# Patient Record
Sex: Female | Born: 1945 | Race: White | Hispanic: No | Smoking: Former smoker
Health system: Southern US, Community
[De-identification: ages and names within clinical notes are randomized; demographics above are authoritative.]

## PROBLEM LIST (undated history)

## (undated) HISTORY — PX: MOUTH SURGERY: SHX715

---

## 2014-12-03 DIAGNOSIS — E785 Hyperlipidemia, unspecified: Secondary | ICD-10-CM | POA: Insufficient documentation

## 2014-12-03 DIAGNOSIS — I10 Essential (primary) hypertension: Secondary | ICD-10-CM | POA: Insufficient documentation

## 2014-12-03 DIAGNOSIS — I6990 Unspecified sequelae of unspecified cerebrovascular disease: Secondary | ICD-10-CM

## 2014-12-03 DIAGNOSIS — I480 Paroxysmal atrial fibrillation: Secondary | ICD-10-CM | POA: Insufficient documentation

## 2014-12-03 DIAGNOSIS — I4891 Unspecified atrial fibrillation: Secondary | ICD-10-CM | POA: Insufficient documentation

## 2014-12-03 HISTORY — DX: Essential (primary) hypertension: I10

## 2014-12-03 HISTORY — DX: Unspecified sequelae of unspecified cerebrovascular disease: I69.90

## 2014-12-03 HISTORY — DX: Unspecified atrial fibrillation: I48.91

## 2014-12-03 HISTORY — DX: Paroxysmal atrial fibrillation: I48.0

## 2014-12-03 HISTORY — DX: Hyperlipidemia, unspecified: E78.5

## 2015-03-30 DIAGNOSIS — I5032 Chronic diastolic (congestive) heart failure: Secondary | ICD-10-CM | POA: Insufficient documentation

## 2015-03-30 DIAGNOSIS — I5022 Chronic systolic (congestive) heart failure: Secondary | ICD-10-CM

## 2015-03-30 HISTORY — DX: Chronic systolic (congestive) heart failure: I50.22

## 2015-03-30 HISTORY — DX: Chronic diastolic (congestive) heart failure: I50.32

## 2016-08-25 DIAGNOSIS — K922 Gastrointestinal hemorrhage, unspecified: Secondary | ICD-10-CM | POA: Diagnosis not present

## 2016-08-25 DIAGNOSIS — E0590 Thyrotoxicosis, unspecified without thyrotoxic crisis or storm: Secondary | ICD-10-CM

## 2016-08-25 DIAGNOSIS — I509 Heart failure, unspecified: Secondary | ICD-10-CM | POA: Diagnosis not present

## 2016-08-25 DIAGNOSIS — E876 Hypokalemia: Secondary | ICD-10-CM

## 2016-08-25 DIAGNOSIS — I4891 Unspecified atrial fibrillation: Secondary | ICD-10-CM

## 2016-08-25 DIAGNOSIS — D649 Anemia, unspecified: Secondary | ICD-10-CM | POA: Diagnosis not present

## 2016-08-25 DIAGNOSIS — I10 Essential (primary) hypertension: Secondary | ICD-10-CM | POA: Diagnosis not present

## 2016-08-26 DIAGNOSIS — I10 Essential (primary) hypertension: Secondary | ICD-10-CM | POA: Diagnosis not present

## 2016-08-26 DIAGNOSIS — E0590 Thyrotoxicosis, unspecified without thyrotoxic crisis or storm: Secondary | ICD-10-CM | POA: Diagnosis not present

## 2016-08-26 DIAGNOSIS — I509 Heart failure, unspecified: Secondary | ICD-10-CM | POA: Diagnosis not present

## 2016-08-26 DIAGNOSIS — D649 Anemia, unspecified: Secondary | ICD-10-CM | POA: Diagnosis not present

## 2016-08-26 DIAGNOSIS — K922 Gastrointestinal hemorrhage, unspecified: Secondary | ICD-10-CM | POA: Diagnosis not present

## 2016-08-26 DIAGNOSIS — I4891 Unspecified atrial fibrillation: Secondary | ICD-10-CM | POA: Diagnosis not present

## 2016-08-26 DIAGNOSIS — E876 Hypokalemia: Secondary | ICD-10-CM | POA: Diagnosis not present

## 2016-08-27 DIAGNOSIS — E876 Hypokalemia: Secondary | ICD-10-CM | POA: Diagnosis not present

## 2016-08-27 DIAGNOSIS — I4891 Unspecified atrial fibrillation: Secondary | ICD-10-CM | POA: Diagnosis not present

## 2016-08-27 DIAGNOSIS — E0590 Thyrotoxicosis, unspecified without thyrotoxic crisis or storm: Secondary | ICD-10-CM | POA: Diagnosis not present

## 2016-08-27 DIAGNOSIS — K922 Gastrointestinal hemorrhage, unspecified: Secondary | ICD-10-CM | POA: Diagnosis not present

## 2016-08-27 DIAGNOSIS — I509 Heart failure, unspecified: Secondary | ICD-10-CM | POA: Diagnosis not present

## 2016-08-27 DIAGNOSIS — D649 Anemia, unspecified: Secondary | ICD-10-CM | POA: Diagnosis not present

## 2016-08-27 DIAGNOSIS — I10 Essential (primary) hypertension: Secondary | ICD-10-CM | POA: Diagnosis not present

## 2016-08-28 DIAGNOSIS — E876 Hypokalemia: Secondary | ICD-10-CM | POA: Diagnosis not present

## 2016-08-28 DIAGNOSIS — I10 Essential (primary) hypertension: Secondary | ICD-10-CM | POA: Diagnosis not present

## 2016-08-28 DIAGNOSIS — E0590 Thyrotoxicosis, unspecified without thyrotoxic crisis or storm: Secondary | ICD-10-CM | POA: Diagnosis not present

## 2016-08-28 DIAGNOSIS — D649 Anemia, unspecified: Secondary | ICD-10-CM | POA: Diagnosis not present

## 2016-08-28 DIAGNOSIS — K922 Gastrointestinal hemorrhage, unspecified: Secondary | ICD-10-CM | POA: Diagnosis not present

## 2016-08-28 DIAGNOSIS — I4891 Unspecified atrial fibrillation: Secondary | ICD-10-CM | POA: Diagnosis not present

## 2016-08-28 DIAGNOSIS — I509 Heart failure, unspecified: Secondary | ICD-10-CM | POA: Diagnosis not present

## 2016-08-29 DIAGNOSIS — I509 Heart failure, unspecified: Secondary | ICD-10-CM | POA: Diagnosis not present

## 2016-08-29 DIAGNOSIS — I10 Essential (primary) hypertension: Secondary | ICD-10-CM | POA: Diagnosis not present

## 2016-08-29 DIAGNOSIS — I4891 Unspecified atrial fibrillation: Secondary | ICD-10-CM | POA: Diagnosis not present

## 2016-08-29 DIAGNOSIS — K922 Gastrointestinal hemorrhage, unspecified: Secondary | ICD-10-CM | POA: Diagnosis not present

## 2016-08-29 DIAGNOSIS — E0590 Thyrotoxicosis, unspecified without thyrotoxic crisis or storm: Secondary | ICD-10-CM | POA: Diagnosis not present

## 2016-08-29 DIAGNOSIS — E876 Hypokalemia: Secondary | ICD-10-CM | POA: Diagnosis not present

## 2016-08-29 DIAGNOSIS — D649 Anemia, unspecified: Secondary | ICD-10-CM | POA: Diagnosis not present

## 2016-08-30 DIAGNOSIS — I10 Essential (primary) hypertension: Secondary | ICD-10-CM | POA: Diagnosis not present

## 2016-08-30 DIAGNOSIS — K922 Gastrointestinal hemorrhage, unspecified: Secondary | ICD-10-CM | POA: Diagnosis not present

## 2016-08-30 DIAGNOSIS — I509 Heart failure, unspecified: Secondary | ICD-10-CM | POA: Diagnosis not present

## 2016-08-30 DIAGNOSIS — D649 Anemia, unspecified: Secondary | ICD-10-CM | POA: Diagnosis not present

## 2016-08-30 DIAGNOSIS — E876 Hypokalemia: Secondary | ICD-10-CM | POA: Diagnosis not present

## 2016-10-11 DIAGNOSIS — K922 Gastrointestinal hemorrhage, unspecified: Secondary | ICD-10-CM

## 2016-10-11 HISTORY — DX: Gastrointestinal hemorrhage, unspecified: K92.2

## 2017-12-05 ENCOUNTER — Encounter: Payer: Self-pay | Admitting: Cardiology

## 2017-12-05 ENCOUNTER — Ambulatory Visit (INDEPENDENT_AMBULATORY_CARE_PROVIDER_SITE_OTHER): Payer: Medicare Other | Admitting: Cardiology

## 2017-12-05 VITALS — BP 132/60 | HR 60 | Resp 14 | Ht 64.0 in | Wt 168.6 lb

## 2017-12-05 DIAGNOSIS — I6990 Unspecified sequelae of unspecified cerebrovascular disease: Secondary | ICD-10-CM | POA: Diagnosis not present

## 2017-12-05 DIAGNOSIS — R0989 Other specified symptoms and signs involving the circulatory and respiratory systems: Secondary | ICD-10-CM

## 2017-12-05 DIAGNOSIS — I10 Essential (primary) hypertension: Secondary | ICD-10-CM

## 2017-12-05 DIAGNOSIS — I480 Paroxysmal atrial fibrillation: Secondary | ICD-10-CM | POA: Diagnosis not present

## 2017-12-05 DIAGNOSIS — I350 Nonrheumatic aortic (valve) stenosis: Secondary | ICD-10-CM

## 2017-12-05 DIAGNOSIS — I5032 Chronic diastolic (congestive) heart failure: Secondary | ICD-10-CM | POA: Diagnosis not present

## 2017-12-05 DIAGNOSIS — E785 Hyperlipidemia, unspecified: Secondary | ICD-10-CM

## 2017-12-05 HISTORY — DX: Nonrheumatic aortic (valve) stenosis: I35.0

## 2017-12-05 NOTE — Patient Instructions (Addendum)
Medication Instructions:  Your physician recommends that you continue on your current medications as directed. Please refer to the Current Medication list given to you today.   Labwork: Your physician recommends that you return for lab work today: BMP, LFT, and lipids   Testing/Procedures: Your physician has requested that you have an echocardiogram. Echocardiography is a painless test that uses sound waves to create images of your heart. It provides your doctor with information about the size and shape of your heart and how well your heart's chambers and valves are working. This procedure takes approximately one hour. There are no restrictions for this procedure.  Your physician has requested that you have a carotid duplex. This test is an ultrasound of the carotid arteries in your neck. It looks at blood flow through these arteries that supply the brain with blood. Allow one hour for this exam. There are no restrictions or special instructions.     Follow-Up: Your physician recommends that you schedule a follow-up appointment in: 3 months.   Any Other Special Instructions Will Be Listed Below (If Applicable).     If you need a refill on your cardiac medications before your next appointment, please call your pharmacy.  Echocardiogram An echocardiogram, or echocardiography, uses sound waves (ultrasound) to produce an image of your heart. The echocardiogram is simple, painless, obtained within a short period of time, and offers valuable information to your health care provider. The images from an echocardiogram can provide information such as:  Evidence of coronary artery disease (CAD).  Heart size.  Heart muscle function.  Heart valve function.  Aneurysm detection.  Evidence of a past heart attack.  Fluid buildup around the heart.  Heart muscle thickening.  Assess heart valve function.  Tell a health care provider about:  Any allergies you have.  All medicines you  are taking, including vitamins, herbs, eye drops, creams, and over-the-counter medicines.  Any problems you or family members have had with anesthetic medicines.  Any blood disorders you have.  Any surgeries you have had.  Any medical conditions you have.  Whether you are pregnant or may be pregnant. What happens before the procedure? No special preparation is needed. Eat and drink normally. What happens during the procedure?  In order to produce an image of your heart, gel will be applied to your chest and a wand-like tool (transducer) will be moved over your chest. The gel will help transmit the sound waves from the transducer. The sound waves will harmlessly bounce off your heart to allow the heart images to be captured in real-time motion. These images will then be recorded.  You may need an IV to receive a medicine that improves the quality of the pictures. What happens after the procedure? You may return to your normal schedule including diet, activities, and medicines, unless your health care provider tells you otherwise. This information is not intended to replace advice given to you by your health care provider. Make sure you discuss any questions you have with your health care provider. Document Released: 03/10/2000 Document Revised: 10/30/2015 Document Reviewed: 11/18/2012 Elsevier Interactive Patient Education  2017 [employer].

## 2017-12-05 NOTE — Progress Notes (Signed)
Cardiology Office Note:    Date:  12/05/2017   ID:  Kathryn Moody, DOB 1946/01/10, MRN [id number]  PCP:  Hortencia Conradi, NP  Cardiologist:  Gypsy Balsam, MD    Referring MD: No ref. provider found   Chief Complaint  Patient presents with  . Follow-up  Doing well  History of Present Illness:    Kathryn Moody is a 72 y.o. female with paroxysmal atrial fibrillation history of CVA, also aortic stenosis has not been seen by me for year.  Seems to be doing fine apparently last summer she had some GI bleed her Eliquis has been discontinued for a while and then she was restarted on a quite interesting dose that she takes 5 mg daily denies having the bleeding anymore.  The pathology of peptic ulcer disease and that was taking care of.  She was also taken off aspirin. Denies having a tightness squeezing pressure burning chest.  There is no shortness of breath there is no passing out  No past medical history on file.    Current Medications: Current Meds  Medication Sig  . apixaban (ELIQUIS) 5 MG TABS tablet Take 1 tablet by mouth daily.   Marland Kitchen atorvastatin (LIPITOR) 10 MG tablet Take 1 tablet by mouth daily.  . Calcium Carb-Ergocalciferol 500-200 MG-UNIT TABS Take 1 tablet by mouth daily.  . carvedilol (COREG) 25 MG tablet Take 1 tablet by mouth 2 (two) times daily.  Marland Kitchen diltiazem (DILT-XR) 240 MG 24 hr capsule Take 2 tablets by mouth daily.   . fluticasone (FLONASE) 50 MCG/ACT nasal spray Place 1 spray into both nostrils daily.  . furosemide (LASIX) 40 MG tablet Take 120 mg by mouth daily.  . furosemide (LASIX) 80 MG tablet Take 1 tablet by mouth daily.  . magnesium oxide (MAG-OX) 400 MG tablet Take 1 tablet by mouth daily.  Marland Kitchen omeprazole (PRILOSEC) 40 MG capsule Take 1 capsule by mouth daily.  . pantoprazole (PROTONIX) 40 MG tablet Take 1 tablet by mouth daily.  . Potassium Chloride ER 20 MEQ TBCR Take 6 tablets by mouth daily.      Allergies:   Prednisone; Digoxin;  and Methimazole   Social History   Socioeconomic History  . Marital status: Married    Spouse name: Not on file  . Number of children: Not on file  . Years of education: Not on file  . Highest education level: Not on file  Occupational History  . Not on file  Social Needs  . Financial resource strain: Not on file  . Food insecurity:    Worry: Not on file    Inability: Not on file  . Transportation needs:    Medical: Not on file    Non-medical: Not on file  Tobacco Use  . Smoking status: Former Games developer  . Smokeless tobacco: Never Used  Substance and Sexual Activity  . Alcohol use: Yes  . Drug use: Never  . Sexual activity: Not on file  Lifestyle  . Physical activity:    Days per week: Not on file    Minutes per session: Not on file  . Stress: Not on file  Relationships  . Social connections:    Talks on phone: Not on file    Gets together: Not on file    Attends religious service: Not on file    Active member of club or organization: Not on file    Attends meetings of clubs or organizations: Not on file    Relationship status: Not  on file  Other Topics Concern  . Not on file  Social History Narrative  . Not on file     Family History: The patient's family history includes Charcot-Marie-Tooth disease in her brother, daughter, and mother; Heart disease in her father; Thyroid disease in her mother. ROS:   Please see the history of present illness.    All 14 point review of systems negative except as described per history of present illness  EKGs/Labs/Other Studies Reviewed:      Recent Labs: No results found for requested labs within last 8760 hours.  Recent Lipid Panel No results found for: CHOL, TRIG, HDL, CHOLHDL, VLDL, LDLCALC, LDLDIRECT  Physical Exam:    VS:  BP 132/60   Pulse 60   Resp 14   Ht 5\' 4"  (1.626 m)   Wt 168 lb 9.6 oz (76.5 kg)   BMI 28.94 kg/m     Wt Readings from Last 3 Encounters:  12/05/17 168 lb 9.6 oz (76.5 kg)     GEN:   Well nourished, well developed in no acute distress HEENT: Normal NECK: No JVD; bilateral carotid bruit LYMPHATICS: No lymphadenopathy CARDIAC: RRR, systolic ejection murmur grade 3/6 best heard at right upper portion of the sternum with radiation towards the neck., no rubs, no gallops RESPIRATORY:  Clear to auscultation without rales, wheezing or rhonchi  ABDOMEN: Soft, non-tender, non-distended MUSCULOSKELETAL:  No edema; No deformity  SKIN: Warm and dry LOWER EXTREMITIES: no swelling NEUROLOGIC:  Alert and oriented x 3 PSYCHIATRIC:  Normal affect   ASSESSMENT:    1. Paroxysmal atrial fibrillation (HCC)   2. Essential hypertension   3. Chronic diastolic congestive heart failure (HCC)   4. Late effects of CVA (cerebrovascular accident)   5. Dyslipidemia   6. Nonrheumatic aortic valve stenosis    PLAN:    In order of problems listed above:  1. Paroxysmal atrial fibrillation seems to be maintaining sinus rhythm.  Anticoagulated with Eliquis however at very strange dosing 5 mg daily.I will ask her to have EKG today to confirm the rhythm. 2. Essential hypertension blood pressure well controlled continue present management. 3. Chronic diastolic congestive heart failure we will ask her to have echocardiogram will be done to recheck left ventricular ejection fraction as well as assess degree of aortic stenosis. 4. Dyslipidemia we will check a fasting lipid profile today she only had breakfast and this is already 3:00 PM 5. Aortic stenosis we will repeat her echocardiogram to recheck on that.  We will schedule her to have bilateral carotid ultrasounds.  It is because of bilateral bruit which is most likely murmur conducted from aortic stenosis however I will make sure she does not have any significant cardiac arterial disease.   Medication Adjustments/Labs and Tests Ordered: Current medicines are reviewed at length with the patient today.  Concerns regarding medicines are outlined above.   No orders of the defined types were placed in this encounter.  Medication changes: No orders of the defined types were placed in this encounter.   Signed, Georgeanna Lea, MD, [facility] 12/05/2017 3:07 PM    [facility]

## 2017-12-06 LAB — HEPATIC FUNCTION PANEL
ALT: 13 IU/L (ref 0–32)
AST: 17 IU/L (ref 0–40)
Albumin: 4.9 g/dL — ABNORMAL HIGH (ref 3.5–4.8)
Alkaline Phosphatase: 110 IU/L (ref 39–117)
Bilirubin Total: 0.3 mg/dL (ref 0.0–1.2)
Bilirubin, Direct: 0.09 mg/dL (ref 0.00–0.40)
Total Protein: 7.6 g/dL (ref 6.0–8.5)

## 2017-12-06 LAB — LIPID PANEL
Chol/HDL Ratio: 2.5 ratio (ref 0.0–4.4)
Cholesterol, Total: 224 mg/dL — ABNORMAL HIGH (ref 100–199)
HDL: 91 mg/dL (ref >39)
LDL Calculated: 115 mg/dL — ABNORMAL HIGH (ref 0–99)
Triglycerides: 90 mg/dL (ref 0–149)
VLDL Cholesterol Cal: 18 mg/dL (ref 5–40)

## 2017-12-06 LAB — BASIC METABOLIC PANEL
BUN/Creatinine Ratio: 20 (ref 12–28)
BUN: 18 mg/dL (ref 8–27)
CO2: 27 mmol/L (ref 20–29)
Calcium: 10.3 mg/dL (ref 8.7–10.3)
Chloride: 99 mmol/L (ref 96–106)
Creatinine, Ser: 0.88 mg/dL (ref 0.57–1.00)
GFR calc Af Amer: 77 mL/min/{1.73_m2} (ref >59)
GFR calc non Af Amer: 67 mL/min/{1.73_m2} (ref >59)
Glucose: 95 mg/dL (ref 65–99)
Potassium: 3.9 mmol/L (ref 3.5–5.2)
Sodium: 142 mmol/L (ref 134–144)

## 2017-12-07 ENCOUNTER — Telehealth: Payer: Self-pay | Admitting: Emergency Medicine

## 2017-12-07 ENCOUNTER — Telehealth: Payer: Self-pay | Admitting: Cardiology

## 2017-12-07 DIAGNOSIS — E785 Hyperlipidemia, unspecified: Secondary | ICD-10-CM

## 2017-12-07 MED ORDER — ATORVASTATIN CALCIUM 20 MG PO TABS: 20.0000 mg | ORAL_TABLET | Freq: Every day | ORAL | 3 refills | Status: DC

## 2017-12-07 MED ORDER — APIXABAN 5 MG PO TABS: 5.0000 mg | ORAL_TABLET | Freq: Two times a day (BID) | ORAL | 3 refills | Status: DC

## 2017-12-07 MED ORDER — APIXABAN 5 MG PO TABS: 5.0000 mg | ORAL_TABLET | Freq: Every day | ORAL | 3 refills | Status: DC

## 2017-12-07 NOTE — Addendum Note (Signed)
Addended by: Lita MainsBOWMAN, Kairie Vangieson R on: 12/07/2017 04:29 PM   Modules accepted: Orders

## 2017-12-07 NOTE — Telephone Encounter (Signed)
Patient asking for clarification on eliquis dosing. Patient wants to know if Dr. Bing MatterKrasowski would like her to take eliquis twice daily rather than once. I will verify with him.

## 2017-12-07 NOTE — Telephone Encounter (Signed)
Informed patient that cholesterol was elevated and to increase lipitor to 20 mg daily per Dr. Bing MatterKrasowski. Patient also in formed to return in 6 weeks for labs. Patient verbally understands.

## 2017-12-07 NOTE — Telephone Encounter (Signed)
Per Dr. Bing MatterKrasowski patient is to take eliquis 5mg  twice daily. Patient informed.

## 2017-12-07 NOTE — Telephone Encounter (Signed)
Patient has questions about the amount of eloquis she is taking

## 2017-12-17 ENCOUNTER — Other Ambulatory Visit: Payer: Self-pay

## 2017-12-17 MED ORDER — APIXABAN 5 MG PO TABS: 5.0000 mg | ORAL_TABLET | Freq: Two times a day (BID) | ORAL | 3 refills | Status: DC

## 2018-01-17 LAB — LIPID PANEL
Chol/HDL Ratio: 2.2 ratio (ref 0.0–4.4)
Cholesterol, Total: 185 mg/dL (ref 100–199)
HDL: 83 mg/dL (ref >39)
LDL Calculated: 87 mg/dL (ref 0–99)
Triglycerides: 74 mg/dL (ref 0–149)
VLDL Cholesterol Cal: 15 mg/dL (ref 5–40)

## 2018-01-17 LAB — ALT: ALT: 14 IU/L (ref 0–32)

## 2018-01-17 LAB — AST: AST: 16 IU/L (ref 0–40)

## 2018-01-22 ENCOUNTER — Ambulatory Visit (INDEPENDENT_AMBULATORY_CARE_PROVIDER_SITE_OTHER): Payer: Medicare Other

## 2018-01-22 ENCOUNTER — Other Ambulatory Visit: Payer: Self-pay

## 2018-01-22 DIAGNOSIS — R0989 Other specified symptoms and signs involving the circulatory and respiratory systems: Secondary | ICD-10-CM | POA: Diagnosis not present

## 2018-01-22 DIAGNOSIS — I5032 Chronic diastolic (congestive) heart failure: Secondary | ICD-10-CM

## 2018-01-22 NOTE — Progress Notes (Signed)
Echocardiogram 2D Echocardiogram has been performed.  Excell Seltzer, [place]

## 2018-01-31 ENCOUNTER — Other Ambulatory Visit: Payer: Self-pay | Admitting: Unknown Physician Specialty

## 2018-01-31 DIAGNOSIS — R195 Other fecal abnormalities: Secondary | ICD-10-CM

## 2018-02-15 ENCOUNTER — Ambulatory Visit
Admission: RE | Admit: 2018-02-15 | Discharge: 2018-02-15 | Disposition: A | Discharge status: Home / Self Care | Payer: Medicare Other | Source: Ambulatory Visit | Attending: Unknown Physician Specialty | Admitting: Unknown Physician Specialty

## 2018-02-15 DIAGNOSIS — R195 Other fecal abnormalities: Secondary | ICD-10-CM

## 2018-03-07 ENCOUNTER — Encounter: Payer: Self-pay | Admitting: Cardiology

## 2018-03-07 ENCOUNTER — Ambulatory Visit (INDEPENDENT_AMBULATORY_CARE_PROVIDER_SITE_OTHER): Payer: Medicare Other | Admitting: Cardiology

## 2018-03-07 VITALS — BP 128/62 | HR 81 | Wt 170.4 lb

## 2018-03-07 DIAGNOSIS — I5032 Chronic diastolic (congestive) heart failure: Secondary | ICD-10-CM

## 2018-03-07 DIAGNOSIS — I10 Essential (primary) hypertension: Secondary | ICD-10-CM | POA: Diagnosis not present

## 2018-03-07 DIAGNOSIS — I480 Paroxysmal atrial fibrillation: Secondary | ICD-10-CM

## 2018-03-07 DIAGNOSIS — I350 Nonrheumatic aortic (valve) stenosis: Secondary | ICD-10-CM

## 2018-03-07 DIAGNOSIS — I6930 Unspecified sequelae of cerebral infarction: Secondary | ICD-10-CM

## 2018-03-07 DIAGNOSIS — I6990 Unspecified sequelae of unspecified cerebrovascular disease: Secondary | ICD-10-CM

## 2018-03-07 DIAGNOSIS — E785 Hyperlipidemia, unspecified: Secondary | ICD-10-CM

## 2018-03-07 DIAGNOSIS — R0989 Other specified symptoms and signs involving the circulatory and respiratory systems: Secondary | ICD-10-CM

## 2018-03-07 HISTORY — DX: Other specified symptoms and signs involving the circulatory and respiratory systems: R09.89

## 2018-03-07 NOTE — Progress Notes (Signed)
Cardiology Office Note:    Date:  03/07/2018   ID:  Kathryn Moody, DOB 08/27/1945, MRN [id number]  PCP:  Hortencia ConradiGeorge, Anita Mcadory E, NP  Cardiologist:  Gypsy Balsamobert Yuvin Bussiere, MD    Referring MD: Hortencia ConradiGeorge, Mashayla Lavin E, NP   Chief Complaint  Patient presents with  . Follow up on testing  Doing well  History of Present Illness:    Kathryn Moody is a 72 y.o. female with paroxysmal atrial fibrillation, anticoagulated with Eliquis 5 mg twice daily, hypertension, history of CVA, aortic stenosis which appears to be mild based on last echocardiogram.  She does have loud left carotid bruit apparently her primary care physician does check her carotid bruit in his office will try to contact the office and get copy of the report if nothing was done within the next last 6 months then we will need to do the test  No past medical history on file.    Current Medications: Current Meds  Medication Sig  . apixaban (ELIQUIS) 5 MG TABS tablet Take 1 tablet (5 mg total) by mouth 2 (two) times daily.  Marland Kitchen. atorvastatin (LIPITOR) 20 MG tablet Take 1 tablet (20 mg total) by mouth daily.  . Calcium Carb-Ergocalciferol 500-200 MG-UNIT TABS Take 1 tablet by mouth daily.  . carvedilol (COREG) 25 MG tablet Take 1 tablet by mouth 2 (two) times daily.  Marland Kitchen. diltiazem (DILT-XR) 240 MG 24 hr capsule Take 2 tablets by mouth daily.   . fluticasone (FLONASE) 50 MCG/ACT nasal spray Place 1 spray into both nostrils daily.  . furosemide (LASIX) 40 MG tablet Take 120 mg by mouth daily.  . furosemide (LASIX) 80 MG tablet Take 1 tablet by mouth daily.  . magnesium oxide (MAG-OX) 400 MG tablet Take 1 tablet by mouth daily.  . methimazole (TAPAZOLE) 10 MG tablet Take 1 tablet by mouth daily.  . pantoprazole (PROTONIX) 40 MG tablet Take 1 tablet by mouth daily.  . Potassium Chloride ER 20 MEQ TBCR Take 6 tablets by mouth daily.      Allergies:   Prednisone and Digoxin   Social History   Socioeconomic History  . Marital  status: Married    Spouse name: Not on file  . Number of children: Not on file  . Years of education: Not on file  . Highest education level: Not on file  Occupational History  . Not on file  Social Needs  . Financial resource strain: Not on file  . Food insecurity:    Worry: Not on file    Inability: Not on file  . Transportation needs:    Medical: Not on file    Non-medical: Not on file  Tobacco Use  . Smoking status: Former Games developermoker  . Smokeless tobacco: Never Used  Substance and Sexual Activity  . Alcohol use: Yes  . Drug use: Never  . Sexual activity: Not on file  Lifestyle  . Physical activity:    Days per week: Not on file    Minutes per session: Not on file  . Stress: Not on file  Relationships  . Social connections:    Talks on phone: Not on file    Gets together: Not on file    Attends religious service: Not on file    Active member of club or organization: Not on file    Attends meetings of clubs or organizations: Not on file    Relationship status: Not on file  Other Topics Concern  . Not on file  Social History  Narrative  . Not on file     Family History: The patient's family history includes Charcot-Marie-Tooth disease in her brother, daughter, and mother; Heart disease in her father; Thyroid disease in her mother. ROS:   Please see the history of present illness.    All 14 point review of systems negative except as described per history of present illness  EKGs/Labs/Other Studies Reviewed:      Recent Labs: 12/05/2017: BUN 18; Creatinine, Ser 0.88; Potassium 3.9; Sodium 142 01/17/2018: ALT 14  Recent Lipid Panel    Component Value Date/Time   CHOL 185 01/17/2018 0923   TRIG 74 01/17/2018 0923   HDL 83 01/17/2018 0923   CHOLHDL 2.2 01/17/2018 0923   LDLCALC 87 01/17/2018 0923    Physical Exam:    VS:  BP 128/62   Pulse 81   Wt 170 lb 6.4 oz (77.3 kg)   SpO2 98%   BMI 29.25 kg/m     Wt Readings from Last 3 Encounters:  03/07/18 170  lb 6.4 oz (77.3 kg)  12/05/17 168 lb 9.6 oz (76.5 kg)     GEN:  Well nourished, well developed in no acute distress HEENT: Normal NECK: No JVD; loud left carotid bruit LYMPHATICS: No lymphadenopathy CARDIAC: RRR, systolic ejection murmur grade 2/6 to 3/6 best heard at the right upper portion of the sternum, no rubs, no gallops RESPIRATORY:  Clear to auscultation without rales, wheezing or rhonchi  ABDOMEN: Soft, non-tender, non-distended MUSCULOSKELETAL:  No edema; No deformity  SKIN: Warm and dry LOWER EXTREMITIES: no swelling NEUROLOGIC:  Alert and oriented x 3 PSYCHIATRIC:  Normal affect   ASSESSMENT:    1. Nonrheumatic aortic valve stenosis   2. Chronic diastolic congestive heart failure (HCC)   3. Essential hypertension   4. Paroxysmal atrial fibrillation (HCC)   5. Late effects of CVA (cerebrovascular accident)   6. Dyslipidemia   7. Left carotid bruit    PLAN:    In order of problems listed above:  1. Aortic stenosis only mild based on last echocardiogram will continue monitoring. 2. Chronic diastolic congestive heart failure appears to be compensated. 3. Essential hypertension blood pressure well controlled continue present management. 4. Paroxysmal atrial fibrillation maintaining sinus rhythm on Eliquis which I will continue. 5. Dyslipidemia continue with Lipitor 20. 6. Left carotid bruit will contact primary care physician to get copy of her last cardiac ultrasound.   Medication Adjustments/Labs and Tests Ordered: Current medicines are reviewed at length with the patient today.  Concerns regarding medicines are outlined above.  No orders of the defined types were placed in this encounter.  Medication changes: No orders of the defined types were placed in this encounter.   Signed, Georgeanna Lea, MD, [facility] 03/07/2018 3:03 PM    [facility]

## 2018-03-07 NOTE — Patient Instructions (Signed)
Medication Instructions:  Your physician recommends that you continue on your current medications as directed. Please refer to the Current Medication list given to you today.  If you need a refill on your cardiac medications before your next appointment, please call your pharmacy.   Lab work: None.  If you have labs (blood work) drawn today and your tests are completely normal, you will receive your results only by: . MyChart Message (if you have MyChart) OR . A paper copy in the mail If you have any lab test that is abnormal or we need to change your treatment, we will call you to review the results.  Testing/Procedures: None.   Follow-Up: At [facility], you and your health needs are our priority.  As part of our continuing mission to provide you with exceptional heart care, we have created designated Provider Care Teams.  These Care Teams include your primary Cardiologist (physician) and Advanced Practice Providers (APPs -  Physician Assistants and Nurse Practitioners) who all work together to provide you with the care you need, when you need it. You will need a follow up appointment in 5 months.  Please call our office 2 months in advance to schedule this appointment.  You may see No primary care provider on file. or another member of our [facility] Provider Team in [place]: Brian Munley, MD . Rajan Revankar, MD  Any Other Special Instructions Will Be Listed Below (If Applicable).    

## 2018-07-16 ENCOUNTER — Telehealth: Payer: Self-pay | Admitting: Cardiology

## 2018-07-16 NOTE — Telephone Encounter (Signed)
Virtual Visit Pre-Appointment Phone Call  "(Name), I am calling you today to discuss your upcoming appointment. We are currently trying to limit exposure to the virus that causes COVID-19 by seeing patients at home rather than in the office."  1. "What is the BEST phone number to call the day of the visit?" - include this in appointment notes  2. Do you have or have access to (through a family member/friend) a smartphone with video capability that we can use for your visit?" a. If yes - list this number in appt notes as cell (if different from BEST phone #) and list the appointment type as a VIDEO visit in appointment notes b. If no - list the appointment type as a PHONE visit in appointment notes  3. Confirm consent - "In the setting of the current Covid19 crisis, you are scheduled for a (phone or video) visit with your provider on (date) at (time).  Just as we do with many in-office visits, in order for you to participate in this visit, we must obtain consent.  If you'd like, I can send this to your mychart (if signed up) or email for you to review.  Otherwise, I can obtain your verbal consent now.  All virtual visits are billed to your insurance company just like a normal visit would be.  By agreeing to a virtual visit, we'd like you to understand that the technology does not allow for your provider to perform an examination, and thus may limit your provider's ability to fully assess your condition. If your provider identifies any concerns that need to be evaluated in person, we will make arrangements to do so.  Finally, though the technology is pretty good, we cannot assure that it will always work on either your or our end, and in the setting of a video visit, we may have to convert it to a phone-only visit.  In either situation, we cannot ensure that we have a secure connection.  Are you willing to proceed?" STAFF: Did the patient verbally acknowledge consent to telehealth visit? Document  YES/NO here: YES  4. Advise patient to be prepared - "Two hours prior to your appointment, go ahead and check your blood pressure, pulse, oxygen saturation, and your weight (if you have the equipment to check those) and write them all down. When your visit starts, your provider will ask you for this information. If you have an Apple Watch or Kardia device, please plan to have heart rate information ready on the day of your appointment. Please have a pen and paper handy nearby the day of the visit as well."  5. Give patient instructions for MyChart download to smartphone OR Doximity/Doxy.me as below if video visit (depending on what platform provider is using)  6. Inform patient they will receive a phone call 15 minutes prior to their appointment time (may be from unknown caller ID) so they should be prepared to answer    TELEPHONE CALL NOTE  Kathryn Moody has been deemed a candidate for a follow-up tele-health visit to limit community exposure during the Covid-19 pandemic. I spoke with the patient via phone to ensure availability of phone/video source, confirm preferred email & phone number, and discuss instructions and expectations.  I reminded Kathryn Moody to be prepared with any vital sign and/or heart rhythm information that could potentially be obtained via home monitoring, at the time of her visit. I reminded Kathryn Moody to expect a phone call prior to  her visit.  Kathryn Moody 07/16/2018 2:38 PM   INSTRUCTIONS FOR DOWNLOADING THE MYCHART APP TO SMARTPHONE  - The patient must first make sure to have activated MyChart and know their login information - If Apple, go to [employer] and type in MyChart in the search bar and download the app. If Android, ask patient to go to [employer] and type in [place] in the search bar and download the app. The app is free but as with any other app downloads, their phone may require them to verify saved payment  information or Apple/Android password.  - The patient will need to then log into the app with their MyChart username and password, and select [place] as their healthcare provider to link the account. When it is time for your visit, go to the MyChart app, find appointments, and click Begin Video Visit. Be sure to Select Allow for your device to access the Microphone and Camera for your visit. You will then be connected, and your provider will be with you shortly.  **If they have any issues connecting, or need assistance please contact MyChart service desk [phone])**  **If using a computer, in order to ensure the best quality for their visit they will need to use either of the following Internet Browsers: [employer] or Google Chrome**  IF USING DOXIMITY or DOXY.ME - The patient will receive a link just prior to their visit by text.     FULL LENGTH CONSENT FOR TELE-HEALTH VISIT   I hereby voluntarily request, consent and authorize [facility] and its employed or contracted physicians, physician assistants, nurse practitioners or other licensed health care professionals (the Practitioner), to provide me with telemedicine health care services (the Services") as deemed necessary by the treating Practitioner. I acknowledge and consent to receive the Services by the Practitioner via telemedicine. I understand that the telemedicine visit will involve communicating with the Practitioner through live audiovisual communication technology and the disclosure of certain medical information by electronic transmission. I acknowledge that I have been given the opportunity to request an in-person assessment or other available alternative prior to the telemedicine visit and am voluntarily participating in the telemedicine visit.  I understand that I have the right to withhold or withdraw my consent to the use of telemedicine in the course of my care at any time, without affecting my right  to future care or treatment, and that the Practitioner or I may terminate the telemedicine visit at any time. I understand that I have the right to inspect all information obtained and/or recorded in the course of the telemedicine visit and may receive copies of available information for a reasonable fee.  I understand that some of the potential risks of receiving the Services via telemedicine include:   Delay or interruption in medical evaluation due to technological equipment failure or disruption;  Information transmitted may not be sufficient (e.g. poor resolution of images) to allow for appropriate medical decision making by the Practitioner; and/or   In rare instances, security protocols could fail, causing a breach of personal health information.  Furthermore, I acknowledge that it is my responsibility to provide information about my medical history, conditions and care that is complete and accurate to the best of my ability. I acknowledge that Practitioner's advice, recommendations, and/or decision may be based on factors not within their control, such as incomplete or inaccurate data provided by me or distortions of diagnostic images or specimens that may result from electronic transmissions. I  understand that the practice of medicine is not an exact science and that Practitioner makes no warranties or guarantees regarding treatment outcomes. I acknowledge that I will receive a copy of this consent concurrently upon execution via email to the email address I last provided but may also request a printed copy by calling the office of [place].    I understand that my insurance will be billed for this visit.   I have read or had this consent read to me.  I understand the contents of this consent, which adequately explains the benefits and risks of the Services being provided via telemedicine.   I have been provided ample opportunity to ask questions regarding this consent and the Services  and have had my questions answered to my satisfaction.  I give my informed consent for the services to be provided through the use of telemedicine in my medical care  By participating in this telemedicine visit I agree to the above.

## 2018-07-26 DIAGNOSIS — G600 Hereditary motor and sensory neuropathy: Secondary | ICD-10-CM

## 2018-07-26 HISTORY — DX: Hereditary motor and sensory neuropathy: G60.0

## 2018-07-30 ENCOUNTER — Telehealth (INDEPENDENT_AMBULATORY_CARE_PROVIDER_SITE_OTHER): Payer: Medicare Other | Admitting: Cardiology

## 2018-07-30 ENCOUNTER — Encounter: Payer: Self-pay | Admitting: Cardiology

## 2018-07-30 ENCOUNTER — Other Ambulatory Visit: Payer: Self-pay

## 2018-07-30 VITALS — BP 105/56 | HR 60 | Wt 168.0 lb

## 2018-07-30 DIAGNOSIS — I5032 Chronic diastolic (congestive) heart failure: Secondary | ICD-10-CM | POA: Diagnosis not present

## 2018-07-30 DIAGNOSIS — I350 Nonrheumatic aortic (valve) stenosis: Secondary | ICD-10-CM

## 2018-07-30 DIAGNOSIS — R002 Palpitations: Secondary | ICD-10-CM

## 2018-07-30 DIAGNOSIS — I480 Paroxysmal atrial fibrillation: Secondary | ICD-10-CM

## 2018-07-30 DIAGNOSIS — I6990 Unspecified sequelae of unspecified cerebrovascular disease: Secondary | ICD-10-CM

## 2018-07-30 DIAGNOSIS — I10 Essential (primary) hypertension: Secondary | ICD-10-CM

## 2018-07-30 NOTE — Progress Notes (Signed)
Virtual Visit via Video Note   This visit type was conducted due to national recommendations for restrictions regarding the COVID-19 Pandemic (e.g. social distancing) in an effort to limit this patient's exposure and mitigate transmission in our community.  Due to her co-morbid illnesses, this patient is at least at moderate risk for complications without adequate follow up.  This format is felt to be most appropriate for this patient at this time.  All issues noted in this document were discussed and addressed.  A limited physical exam was performed with this format.  Please refer to the patient's chart for her consent to telehealth for [facility].  Evaluation Performed:  Follow-up visit  This visit type was conducted due to national recommendations for restrictions regarding the COVID-19 Pandemic (e.g. social distancing).  This format is felt to be most appropriate for this patient at this time.  All issues noted in this document were discussed and addressed.  No physical exam was performed (except for noted visual exam findings with Video Visits).  Please refer to the patient's chart (MyChart message for video visits and phone note for telephone visits) for the patient's consent to telehealth for [facility].  Date:  07/30/2018  ID: [name], DOB 1946/02/16, MRN [id number]   Patient Location: [address] [place] [address]   Provider location:   [facility]  PCP:  Hortencia Conradi, NP  Cardiologist:  Gypsy Balsam, MD     Chief Complaint: I have some palpitations  History of Present Illness:    Kathryn Moody is a 73 y.o. female  who presents via audio/video conferencing for a telehealth visit today.  With paroxysmal atrial fibrillation, anticoagulated, also remote history of CVA, mild aortic stenosis, essential hypertension, diastolic congestive heart failure.  We do have a video visit today to talk about her problems she described  rare episode of palpitations she said she feels exactly similar like she felt when she was 43st time recognized to have atrial fibrillation.  She feels her heart speeding up going towards her back.  There is no shortness of breath no chest pain associated with this sensation overall she seems to be doing well from that point review.   The patient does not have symptoms concerning for COVID-19 infection (fever, chills, cough, or new SHORTNESS OF BREATH).    Prior CV studies:   The following studies were reviewed today:  Echocardiogram done in the fall of last year showed  - Normal LVEF.   Mild AS.   Trace AR, TR MR.     No past medical history on file.     Current Meds  Medication Sig  . apixaban (ELIQUIS) 5 MG TABS tablet Take 1 tablet (5 mg total) by mouth 2 (two) times daily.  Marland Kitchen atorvastatin (LIPITOR) 20 MG tablet Take 1 tablet (20 mg total) by mouth daily.  . Calcium Carb-Ergocalciferol 500-200 MG-UNIT TABS Take 1 tablet by mouth daily.  . carvedilol (COREG) 25 MG tablet Take 1 tablet by mouth 2 (two) times daily.  Marland Kitchen diltiazem (DILT-XR) 240 MG 24 hr capsule Take 2 tablets by mouth daily.   . fluticasone (FLONASE) 50 MCG/ACT nasal spray Place 1 spray into both nostrils daily.  . furosemide (LASIX) 40 MG tablet Take 120 mg by mouth daily.  . furosemide (LASIX) 80 MG tablet Take 1 tablet by mouth daily.  . magnesium oxide (MAG-OX) 400 MG tablet Take 1 tablet by mouth daily.  . methimazole (TAPAZOLE)  10 MG tablet Take 1 tablet by mouth daily.  . pantoprazole (PROTONIX) 40 MG tablet Take 1 tablet by mouth daily.  . Potassium Chloride ER 20 MEQ TBCR Take 6 tablets by mouth daily.       Family History: The patient's family history includes Charcot-Marie-Tooth disease in her brother, daughter, and mother; Heart disease in her father; Thyroid disease in her mother.   ROS:   Please see the history of present illness.     All other systems reviewed and are negative.    Labs/Other Tests and Data Reviewed:     Recent Labs: 12/05/2017: BUN 18; Creatinine, Ser 0.88; Potassium 3.9; Sodium 142 01/17/2018: ALT 14  Recent Lipid Panel    Component Value Date/Time   CHOL 185 01/17/2018 0923   TRIG 74 01/17/2018 0923   HDL 83 01/17/2018 0923   CHOLHDL 2.2 01/17/2018 0923   LDLCALC 87 01/17/2018 0923      Exam:    Vital Signs:  BP (!) 105/56   Pulse 60   Wt 168 lb (76.2 kg)   BMI 28.84 kg/m     Wt Readings from Last 3 Encounters:  07/30/18 168 lb (76.2 kg)  03/07/18 170 lb 6.4 oz (77.3 kg)  12/05/17 168 lb 9.6 oz (76.5 kg)     Well nourished, well developed in no acute distress. Alert awake oriented x3 not in distress actually quite happy to be able to talk to me over the video link.  Diagnosis for this visit:   1. Chronic diastolic congestive heart failure (HCC)   2. Essential hypertension   3. Paroxysmal atrial fibrillation (HCC)   4. Nonrheumatic aortic valve stenosis   5. Late effects of CVA (cerebrovascular accident)      ASSESSMENT & PLAN:    1.  Chronic diastolic congestive heart failure compensated with quite significant dose of diuretic denies having any significant swelling of lower extremities but shortness of breath is still there no changes.  We will not change any of her medications. 2.  Essential hypertension blood pressure well controlled we will continue present management. 3.  Paroxysmal atrial fibrillation described to have some palpitation will send event recorder for her for 1 month to see if she get any significant recurrences of atrial fibrillation.  So far she is only on AV blocking agent. 4.  Aortic stenosis which is only mild based on last echocardiogram. 5.  Late effect CVA.  No new problems.  COVID-19 Education: The signs and symptoms of COVID-19 were discussed with the patient and how to seek care for testing (follow up with PCP or arrange E-visit).  The importance of social distancing was discussed today.   Patient Risk:   After full review of this patients clinical status, I feel that they are at least moderate risk at this time.  Time:   Today, I have spent 19 minutes with the patient with telehealth technology discussing pt health issues.  I spent 5 minutes reviewing her chart before the visit.  Visit was finished at 1:14 PM.    Medication Adjustments/Labs and Tests Ordered: Current medicines are reviewed at length with the patient today.  Concerns regarding medicines are outlined above.  No orders of the defined types were placed in this encounter.  Medication changes: No orders of the defined types were placed in this encounter.    Disposition: Follow-up in 4 months.  0 patch for 1 week.  Signed, Georgeanna Leaobert J. Kemper Hochman, MD, [facility] 07/30/2018 1:14 PM    [facility]

## 2018-07-30 NOTE — Patient Instructions (Signed)
Medication Instructions:  Your physician recommends that you continue on your current medications as directed. Please refer to the Current Medication list given to you today.  If you need a refill on your cardiac medications before your next appointment, please call your pharmacy.   Lab work: None ordered If you have labs (blood work) drawn today and your tests are completely normal, you will receive your results only by: Marland Kitchen MyChart Message (if you have MyChart) OR . A paper copy in the mail If you have any lab test that is abnormal or we need to change your treatment, we will call you to review the results.  Testing/Procedures: Your physician has recommended that you wear a holter monitor. Holter monitors are medical devices that record the heart's electrical activity. Doctors most often use these monitors to diagnose arrhythmias. Arrhythmias are problems with the speed or rhythm of the heartbeat. The monitor is a small, portable device. You can wear one while you do your normal daily activities. This is usually used to diagnose what is causing palpitations/syncope (passing out). You will wear this monitor for 1 week.  Follow-Up: At [facility], you and your health needs are our priority.  As part of our continuing mission to provide you with exceptional heart care, we have created designated Provider Care Teams.  These Care Teams include your primary Cardiologist (physician) and Advanced Practice Providers (APPs -  Physician Assistants and Nurse Practitioners) who all work together to provide you with the care you need, when you need it. You will need a follow up appointment in 4 months. You may see Gypsy Balsam or another member of our [employer] Provider Team in [place]: Norman Herrlich, MD . Belva Crome, MD

## 2018-08-05 ENCOUNTER — Other Ambulatory Visit: Payer: Medicare Other

## 2018-09-10 ENCOUNTER — Telehealth: Payer: Self-pay | Admitting: *Deleted

## 2018-09-10 NOTE — Telephone Encounter (Signed)
Informed patient of monitor results and no changes in management.

## 2018-09-10 NOTE — Telephone Encounter (Signed)
Pt has questions about results for heart monitor. Please call.

## 2018-10-01 ENCOUNTER — Telehealth: Payer: Self-pay | Admitting: Cardiology

## 2018-10-01 NOTE — Telephone Encounter (Signed)
Returned patient call. Not wanting to know about lasix. States Dr. Melina Copa wants to do a colonoscopy and wanted clearance. Informed her Dr. Melina Copa needs to call/fax request with what he wants to hold. Gave her the fax number and she is going to contact Dr. Melina Copa to send request.

## 2018-10-01 NOTE — Telephone Encounter (Signed)
Has questions about her Lasix dosage

## 2018-12-11 ENCOUNTER — Telehealth (INDEPENDENT_AMBULATORY_CARE_PROVIDER_SITE_OTHER): Payer: Medicare Other | Admitting: Cardiology

## 2018-12-11 ENCOUNTER — Encounter: Payer: Self-pay | Admitting: Cardiology

## 2018-12-11 ENCOUNTER — Other Ambulatory Visit: Payer: Self-pay

## 2018-12-11 VITALS — Wt 166.0 lb

## 2018-12-11 DIAGNOSIS — I6990 Unspecified sequelae of unspecified cerebrovascular disease: Secondary | ICD-10-CM

## 2018-12-11 DIAGNOSIS — I5032 Chronic diastolic (congestive) heart failure: Secondary | ICD-10-CM

## 2018-12-11 DIAGNOSIS — I10 Essential (primary) hypertension: Secondary | ICD-10-CM

## 2018-12-11 DIAGNOSIS — I480 Paroxysmal atrial fibrillation: Secondary | ICD-10-CM

## 2018-12-11 DIAGNOSIS — I350 Nonrheumatic aortic (valve) stenosis: Secondary | ICD-10-CM

## 2018-12-11 NOTE — Progress Notes (Signed)
Virtual Visit via Video Note   This visit type was conducted due to national recommendations for restrictions regarding the COVID-19 Pandemic (e.g. social distancing) in an effort to limit this patient's exposure and mitigate transmission in our community.  Due to her co-morbid illnesses, this patient is at least at moderate risk for complications without adequate follow up.  This format is felt to be most appropriate for this patient at this time.  All issues noted in this document were discussed and addressed.  A limited physical exam was performed with this format.  Please refer to the patient's chart for her consent to telehealth for [facility].  Evaluation Performed:  Follow-up visit  This visit type was conducted due to national recommendations for restrictions regarding the COVID-19 Pandemic (e.g. social distancing).  This format is felt to be most appropriate for this patient at this time.  All issues noted in this document were discussed and addressed.  No physical exam was performed (except for noted visual exam findings with Video Visits).  Please refer to the patient's chart (MyChart message for video visits and phone note for telephone visits) for the patient's consent to telehealth for [facility].  Date:  12/11/2018  ID: [name], DOB 01-19-1946, MRN [id number]   Patient Location: [address] [place] [address]   Provider location:   [place] [facility]  PCP:  Zoila Shutter, NP  Cardiologist:  Jenne Campus, MD     Chief Complaint: Doing well cardiac wise  History of Present Illness:    Kathryn Moody is a 73 y.o. female  who presents via audio/video conferencing for a telehealth visit today.  Past medical history significant for CVA, paroxysmal atrial fibrillation, anticoagulated, essential hypertension, mild aortic stenosis, diastolic congestive heart failure.  She does have a tele-visit with me today in the matter-of-fact  today she is getting ready to have her colonoscopy after my visit she will go to Dr. Mallie Mussel office and she will have colonoscopy done.  Denies have any cardiac complaints described to have some weakness in her legs but admits she does not do much and she blame lack of activity on it and I think she is right denies have any chest pain tightness squeezing pressure burning chest no swelling of lower extremities no palpitations.   The patient does not have symptoms concerning for COVID-19 infection (fever, chills, cough, or new SHORTNESS OF BREATH).    Prior CV studies:   The following studies were reviewed today:       No past medical history on file.     Current Meds  Medication Sig  . apixaban (ELIQUIS) 5 MG TABS tablet Take 1 tablet (5 mg total) by mouth 2 (two) times daily.  Marland Kitchen atorvastatin (LIPITOR) 20 MG tablet Take 1 tablet (20 mg total) by mouth daily.  . Calcium Carb-Ergocalciferol 500-200 MG-UNIT TABS Take 1 tablet by mouth daily.  . carvedilol (COREG) 25 MG tablet Take 1 tablet by mouth 2 (two) times daily.  Marland Kitchen diltiazem (DILT-XR) 240 MG 24 hr capsule Take 2 tablets by mouth daily.   . fluticasone (FLONASE) 50 MCG/ACT nasal spray Place 1 spray into both nostrils daily.  . furosemide (LASIX) 40 MG tablet Take 120 mg by mouth daily.  . furosemide (LASIX) 80 MG tablet Take 1 tablet by mouth daily.  . magnesium oxide (MAG-OX) 400 MG tablet Take 1 tablet by mouth daily.  . methimazole (TAPAZOLE) 10 MG tablet Take 1 tablet by  mouth daily.  . pantoprazole (PROTONIX) 40 MG tablet Take 1 tablet by mouth daily.  . Potassium Chloride ER 20 MEQ TBCR Take 6 tablets by mouth daily.       Family History: The patient's family history includes Charcot-Marie-Tooth disease in her brother, daughter, and mother; Heart disease in her father; Thyroid disease in her mother.   ROS:   Please see the history of present illness.     All other systems reviewed and are negative.   Labs/Other  Tests and Data Reviewed:     Recent Labs: 01/17/2018: ALT 14  Recent Lipid Panel    Component Value Date/Time   CHOL 185 01/17/2018 0923   TRIG 74 01/17/2018 0923   HDL 83 01/17/2018 0923   CHOLHDL 2.2 01/17/2018 0923   LDLCALC 87 01/17/2018 0923      Exam:    Vital Signs:  Wt 166 lb (75.3 kg)   BMI 28.49 kg/m     Wt Readings from Last 3 Encounters:  12/11/18 166 lb (75.3 kg)  07/30/18 168 lb (76.2 kg)  03/07/18 170 lb 6.4 oz (77.3 kg)     Well nourished, well developed in no acute distress. Alert awake and x3 talking to me from her home.  [facility] she is not in distress  Diagnosis for this visit:   1. Chronic diastolic congestive heart failure (HCC)   2. Essential hypertension   3. Paroxysmal atrial fibrillation (HCC)   4. Nonrheumatic aortic valve stenosis   5. Late effects of CVA (cerebrovascular accident)      ASSESSMENT & PLAN:    1.  Chronic diastolic congestive heart failure stable on appropriate medications which I will continue 2.  Essential hypertension blood pressure well controlled continue present management. 3.  Paroxysmal atrial fibrillation anticoagulated continue denies having any palpitations her monitor did not show any evidence of atrial fibrillation 4.  Nonrheumatic aortic valve stenosis only mild we will continue present management she will require another echocardiogram next month 5.  Late effects of CVA doing well from that point review  COVID-19 Education: The signs and symptoms of COVID-19 were discussed with the patient and how to seek care for testing (follow up with PCP or arrange E-visit).  The importance of social distancing was discussed today.  Patient Risk:   After full review of this patients clinical status, I feel that they are at least moderate risk at this time.  Time:   Today, I have spent 15 minutes with the patient with telehealth technology discussing pt health issues.  I spent 5  minutes reviewing her chart before the visit.  Visit was finished at 11:33 AM.    Medication Adjustments/Labs and Tests Ordered: Current medicines are reviewed at length with the patient today.  Concerns regarding medicines are outlined above.  No orders of the defined types were placed in this encounter.  Medication changes: No orders of the defined types were placed in this encounter.    Disposition: Follow-up 4 months  Signed, Georgeanna Leaobert J. Abdimalik Mayorquin, MD, [facility]ACC 12/11/2018 11:31 AM    [facility]

## 2018-12-11 NOTE — Patient Instructions (Addendum)
Medication Instructions:  Your physician recommends that you continue on your current medications as directed. Please refer to the Current Medication list given to you today.  If you need a refill on your cardiac medications before your next appointment, please call your pharmacy.   Lab work: None.  If you have labs (blood work) drawn today and your tests are completely normal, you will receive your results only by: . MyChart Message (if you have MyChart) OR . A paper copy in the mail If you have any lab test that is abnormal or we need to change your treatment, we will call you to review the results.  Testing/Procedures: None.    Follow-Up: At [facility], you and your health needs are our priority.  As part of our continuing mission to provide you with exceptional heart care, we have created designated Provider Care Teams.  These Care Teams include your primary Cardiologist (physician) and Advanced Practice Providers (APPs -  Physician Assistants and Nurse Practitioners) who all work together to provide you with the care you need, when you need it. You will need a follow up appointment in 4 months.  Please call our office 2 months in advance to schedule this appointment.  You may see No primary care provider on file. or another member of our [facility] Provider Team in [place]: Brian Munley, MD . Rajan Revankar, MD  Any Other Special Instructions Will Be Listed Below (If Applicable).     

## 2018-12-31 ENCOUNTER — Other Ambulatory Visit: Payer: Self-pay | Admitting: Cardiology

## 2019-04-16 ENCOUNTER — Ambulatory Visit (INDEPENDENT_AMBULATORY_CARE_PROVIDER_SITE_OTHER): Payer: Medicare Other | Admitting: Cardiology

## 2019-04-16 ENCOUNTER — Other Ambulatory Visit: Payer: Self-pay

## 2019-04-16 ENCOUNTER — Encounter: Payer: Self-pay | Admitting: Cardiology

## 2019-04-16 VITALS — BP 134/74 | HR 55 | Ht 64.0 in | Wt 173.0 lb

## 2019-04-16 DIAGNOSIS — I5032 Chronic diastolic (congestive) heart failure: Secondary | ICD-10-CM

## 2019-04-16 DIAGNOSIS — I6990 Unspecified sequelae of unspecified cerebrovascular disease: Secondary | ICD-10-CM

## 2019-04-16 DIAGNOSIS — I480 Paroxysmal atrial fibrillation: Secondary | ICD-10-CM | POA: Diagnosis not present

## 2019-04-16 DIAGNOSIS — I350 Nonrheumatic aortic (valve) stenosis: Secondary | ICD-10-CM | POA: Diagnosis not present

## 2019-04-16 DIAGNOSIS — I10 Essential (primary) hypertension: Secondary | ICD-10-CM | POA: Diagnosis not present

## 2019-04-16 NOTE — Patient Instructions (Signed)
Medication Instructions:  Your physician recommends that you continue on your current medications as directed. Please refer to the Current Medication list given to you today.  *If you need a refill on your cardiac medications before your next appointment, please call your pharmacy*  Lab Work: None.  If you have labs (blood work) drawn today and your tests are completely normal, you will receive your results only by: . MyChart Message (if you have MyChart) OR . A paper copy in the mail If you have any lab test that is abnormal or we need to change your treatment, we will call you to review the results.  Testing/Procedures: Your physician has requested that you have an echocardiogram. Echocardiography is a painless test that uses sound waves to create images of your heart. It provides your doctor with information about the size and shape of your heart and how well your heart's chambers and valves are working. This procedure takes approximately one hour. There are no restrictions for this procedure.    Follow-Up: At [facility], you and your health needs are our priority.  As part of our continuing mission to provide you with exceptional heart care, we have created designated Provider Care Teams.  These Care Teams include your primary Cardiologist (physician) and Advanced Practice Providers (APPs -  Physician Assistants and Nurse Practitioners) who all work together to provide you with the care you need, when you need it.  Your next appointment:   6 month(s)  The format for your next appointment:   In Person  Provider:   Robert Krasowski, MD  Other Instructions   Echocardiogram An echocardiogram is a procedure that uses painless sound waves (ultrasound) to produce an image of the heart. Images from an echocardiogram can provide important information about:  Signs of coronary artery disease (CAD).  Aneurysm detection. An aneurysm is a weak or damaged part of an artery wall that  bulges out from the normal force of blood pumping through the body.  Heart size and shape. Changes in the size or shape of the heart can be associated with certain conditions, including heart failure, aneurysm, and CAD.  Heart muscle function.  Heart valve function.  Signs of a past heart attack.  Fluid buildup around the heart.  Thickening of the heart muscle.  A tumor or infectious growth around the heart valves. Tell a health care provider about:  Any allergies you have.  All medicines you are taking, including vitamins, herbs, eye drops, creams, and over-the-counter medicines.  Any blood disorders you have.  Any surgeries you have had.  Any medical conditions you have.  Whether you are pregnant or may be pregnant. What are the risks? Generally, this is a safe procedure. However, problems may occur, including:  Allergic reaction to dye (contrast) that may be used during the procedure. What happens before the procedure? No specific preparation is needed. You may eat and drink normally. What happens during the procedure?   An IV tube may be inserted into one of your veins.  You may receive contrast through this tube. A contrast is an injection that improves the quality of the pictures from your heart.  A gel will be applied to your chest.  A wand-like tool (transducer) will be moved over your chest. The gel will help to transmit the sound waves from the transducer.  The sound waves will harmlessly bounce off of your heart to allow the heart images to be captured in real-time motion. The images will be recorded   on a computer. The procedure may vary among health care providers and hospitals. What happens after the procedure?  You may return to your normal, everyday life, including diet, activities, and medicines, unless your health care provider tells you not to do that. Summary  An echocardiogram is a procedure that uses painless sound waves (ultrasound) to produce  an image of the heart.  Images from an echocardiogram can provide important information about the size and shape of your heart, heart muscle function, heart valve function, and fluid buildup around your heart.  You do not need to do anything to prepare before this procedure. You may eat and drink normally.  After the echocardiogram is completed, you may return to your normal, everyday life, unless your health care provider tells you not to do that. This information is not intended to replace advice given to you by your health care provider. Make sure you discuss any questions you have with your health care provider. Document Revised: 07/04/2018 Document Reviewed: 04/15/2016 Elsevier Patient Education  2020 Elsevier Inc.   

## 2019-04-16 NOTE — Progress Notes (Signed)
Cardiology Office Note:    Date:  04/16/2019   ID:  [name], DOB 02/28/1946, MRN [id number]  PCP:  Zoila Shutter, NP  Cardiologist:  Jenne Campus, MD    Referring MD: Zoila Shutter, NP   Chief Complaint  Patient presents with  . Follow-up    4 MO FU     History of Present Illness:    Kathryn Moody is a 74 y.o. female with past medical history significant for diastolic congestive heart failure, aortic stenosis, last assessment last year showing mild aortic stenosis, paroxysmal atrial fibrillation, anticoagulated with Eliquis, comes today to my office to follow-up she complained of having more shortness of breath than before.  Denies have any chest pain palpitation dizziness or passing out but describes 2 episodes which she ended up falling on her knees she is not sure exactly how it happened she tells me straight she did not passed out just she and that being on her knees.  Past Medical History:  Diagnosis Date  . Aortic stenosis 12/05/2017  . Chronic diastolic congestive heart failure ([place]) 03/30/2015  . 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 Dyslipidemia 12/03/2014  . Essential hypertension 12/03/2014  . GI bleed 10/11/2016  . Late effects of CVA (cerebrovascular accident) 12/03/2014  . Left carotid bruit 03/07/2018  . Paroxysmal atrial fibrillation (HCC) 12/03/2014   Chads fast score equals 5, anticoagulated with Eliquis Chads fast score equals 5, anticoagulated with Eliquis      Current Medications: Current Meds  Medication Sig  . apixaban (ELIQUIS) 5 MG TABS tablet Take 1 tablet (5 mg total) by mouth 2 (two) times daily.  Marland Kitchen atorvastatin (LIPITOR) 20 MG tablet Take 1 tablet by mouth daily  . Calcium Carb-Ergocalciferol 500-200 MG-UNIT TABS Take 1 tablet by mouth daily.  . carvedilol (COREG) 25 MG tablet Take 1 tablet by mouth 2 (two) times daily.  Marland Kitchen diltiazem (DILT-XR) 240 MG 24 hr capsule Take 2 tablets by mouth daily.   . fluticasone  (FLONASE) 50 MCG/ACT nasal spray Place 1 spray into both nostrils daily.  . furosemide (LASIX) 40 MG tablet Take 120 mg by mouth daily.  . furosemide (LASIX) 80 MG tablet Take 1 tablet by mouth daily.  . magnesium oxide (MAG-OX) 400 MG tablet Take 1 tablet by mouth daily.  . methimazole (TAPAZOLE) 10 MG tablet Take 1 tablet by mouth daily.  . pantoprazole (PROTONIX) 40 MG tablet Take 1 tablet by mouth daily.  . Potassium Chloride ER 20 MEQ TBCR Take 6 tablets by mouth daily.      Allergies:   Prednisone and Digoxin   Social History   Socioeconomic History  . Marital status: Married    Spouse name: Not on file  . Number of children: Not on file  . Years of education: Not on file  . Highest education level: Not on file  Occupational History  . Not on file  Tobacco Use  . Smoking status: Former Research scientist (life sciences)  . Smokeless tobacco: Never Used  Substance and Sexual Activity  . Alcohol use: Yes  . Drug use: Never  . Sexual activity: Not Currently  Other Topics Concern  . Not on file  Social History Narrative  . Not on file   Social Determinants of Health   Financial Resource Strain:   . Difficulty of Paying Living Expenses: Not on file  Food Insecurity:   . Worried About Charity fundraiser in the Last Year: Not on file  . Ran Out of  Food in the Last Year: Not on file  Transportation Needs:   . Lack of Transportation (Medical): Not on file  . Lack of Transportation (Non-Medical): Not on file  Physical Activity:   . Days of Exercise per Week: Not on file  . Minutes of Exercise per Session: Not on file  Stress:   . Feeling of Stress : Not on file  Social Connections:   . Frequency of Communication with Friends and Family: Not on file  . Frequency of Social Gatherings with Friends and Family: Not on file  . Attends Religious Services: Not on file  . Active Member of Clubs or Organizations: Not on file  . Attends Banker Meetings: Not on file  . Marital Status: Not on  file     Family History: The patient's family history includes Charcot-Marie-Tooth disease in her brother, daughter, and mother; Heart disease in her father; Thyroid disease in her mother. ROS:   Please see the history of present illness.    All 14 point review of systems negative except as described per history of present illness  EKGs/Labs/Other Studies Reviewed:      Recent Labs: No results found for requested labs within last 8760 hours.  Recent Lipid Panel    Component Value Date/Time   CHOL 185 01/17/2018 0923   TRIG 74 01/17/2018 0923   HDL 83 01/17/2018 0923   CHOLHDL 2.2 01/17/2018 0923   LDLCALC 87 01/17/2018 0923    Physical Exam:    VS:  BP 134/74 (BP Location: Left Arm, Patient Position: Sitting, Cuff Size: Normal)   Pulse (!) 55   Ht 5\' 4"  (1.626 m)   Wt 173 lb (78.5 kg)   SpO2 99%   BMI 29.70 kg/m     Wt Readings from Last 3 Encounters:  04/16/19 173 lb (78.5 kg)  12/11/18 166 lb (75.3 kg)  07/30/18 168 lb (76.2 kg)     GEN:  Well nourished, well developed in no acute distress HEENT: Normal NECK: No JVD; No carotid bruits LYMPHATICS: No lymphadenopathy CARDIAC: RRR, systolic ejection murmur grade 2/6 best heard right upper portion of the sternum, S2 is still present but soft, no rubs, no gallops RESPIRATORY:  Clear to auscultation without rales, wheezing or rhonchi  ABDOMEN: Soft, non-tender, non-distended MUSCULOSKELETAL:  No edema; No deformity  SKIN: Warm and dry LOWER EXTREMITIES: no swelling NEUROLOGIC:  Alert and oriented x 3 PSYCHIATRIC:  Normal affect   ASSESSMENT:    1. Chronic diastolic congestive heart failure (HCC)   2. Essential hypertension   3. Paroxysmal atrial fibrillation (HCC)   4. Nonrheumatic aortic valve stenosis   5. Late effects of CVA (cerebrovascular accident)    PLAN:    In order of problems listed above:  1. Chronic diastolic congestive heart failure she seems to be compensated on physical examination but  complained of having more shortness of breath.  I will schedule her to have an echocardiogram done to assess left ventricle ejection fraction. 2. Essential hypertension blood pressure controlled today 134/74, will continue present management. 3. Paroxysmal atrial fibrillation.  Her heart rate is 53.  She is mentating sinus rhythm, she is anticoagulated with chads 2 vascular equals 6. 4. Nonrheumatic aortic valve stenosis.  Will do echocardiogram to assess the lesion 5. Late effects of CVA, no new problems.   Medication Adjustments/Labs and Tests Ordered: Current medicines are reviewed at length with the patient today.  Concerns regarding medicines are outlined above.  No orders of the  defined types were placed in this encounter.  Medication changes: No orders of the defined types were placed in this encounter.   Signed, Georgeanna Lea, MD, [facility] 04/16/2019 2:26 PM    [facility]

## 2019-04-18 ENCOUNTER — Ambulatory Visit (HOSPITAL_BASED_OUTPATIENT_CLINIC_OR_DEPARTMENT_OTHER)
Admission: RE | Admit: 2019-04-18 | Discharge: 2019-04-18 | Disposition: A | Discharge status: Home / Self Care | Payer: Medicare Other | Source: Ambulatory Visit | Attending: Cardiology | Admitting: Cardiology

## 2019-04-18 ENCOUNTER — Other Ambulatory Visit: Payer: Self-pay

## 2019-04-18 DIAGNOSIS — I480 Paroxysmal atrial fibrillation: Secondary | ICD-10-CM | POA: Diagnosis not present

## 2019-04-18 DIAGNOSIS — I350 Nonrheumatic aortic (valve) stenosis: Secondary | ICD-10-CM | POA: Diagnosis present

## 2019-04-18 DIAGNOSIS — I5032 Chronic diastolic (congestive) heart failure: Secondary | ICD-10-CM

## 2019-04-18 NOTE — Progress Notes (Signed)
  Echocardiogram 2D Echocardiogram has been performed.  [name] 04/18/2019, 4:02 PM

## 2019-06-06 DIAGNOSIS — E042 Nontoxic multinodular goiter: Secondary | ICD-10-CM

## 2019-06-06 DIAGNOSIS — E039 Hypothyroidism, unspecified: Secondary | ICD-10-CM | POA: Insufficient documentation

## 2019-06-06 DIAGNOSIS — E0590 Thyrotoxicosis, unspecified without thyrotoxic crisis or storm: Secondary | ICD-10-CM | POA: Insufficient documentation

## 2019-06-06 HISTORY — DX: Hypothyroidism, unspecified: E03.9

## 2019-06-06 HISTORY — DX: Thyrotoxicosis, unspecified without thyrotoxic crisis or storm: E05.90

## 2019-06-06 HISTORY — DX: Nontoxic multinodular goiter: E04.2

## 2019-09-26 ENCOUNTER — Telehealth: Payer: Self-pay | Admitting: Cardiology

## 2019-09-26 NOTE — Telephone Encounter (Signed)
Asyia is returning Hayley's call.

## 2019-09-26 NOTE — Telephone Encounter (Signed)
Called patient and she reports increased shortness of breath over the past couple weeks. She denies weight gain. She does have lower extremity swelling but nothing new or worse than she normally does. I was able to get her a appointment on 10/16/19. Will consult with Dr. Bing Matter to see if he has other recommendations in the mean time.

## 2019-09-26 NOTE — Telephone Encounter (Signed)
Pt c/o Shortness Of Breath: STAT if SOB developed within the last 24 hours or pt is noticeably SOB on the phone  1. Are you currently SOB (can you hear that pt is SOB on the phone)? no  2. How long have you been experiencing SOB? Last couple of weeks  3. Are you SOB when sitting or when up moving around? Moving around  4. Are you currently experiencing any other symptoms? No  Patient states she has been having SOB and would like to know if she can be seen this month. I did not see anything available with Dr. Bing Matter until August.

## 2019-09-26 NOTE — Telephone Encounter (Signed)
Left message for patent to return call

## 2019-09-30 NOTE — Telephone Encounter (Signed)
Please call her and ask her how she is doing today.

## 2019-09-30 NOTE — Telephone Encounter (Signed)
Called patient she reports that she is actually feeling better today as far as shortness of breath and swelling is better as well. She recently got covid vaccine and thinks maybe her symptoms were from that. But overall she feels better and plans to see Dr. Bing Matter on July 22nd, 2021.

## 2019-10-08 ENCOUNTER — Other Ambulatory Visit: Payer: Self-pay | Admitting: Cardiology

## 2019-10-16 ENCOUNTER — Ambulatory Visit (INDEPENDENT_AMBULATORY_CARE_PROVIDER_SITE_OTHER): Payer: Medicare Other | Admitting: Cardiology

## 2019-10-16 ENCOUNTER — Other Ambulatory Visit: Payer: Self-pay

## 2019-10-16 ENCOUNTER — Encounter: Payer: Self-pay | Admitting: Cardiology

## 2019-10-16 VITALS — BP 126/70 | HR 59 | Ht 64.0 in | Wt 161.0 lb

## 2019-10-16 DIAGNOSIS — I350 Nonrheumatic aortic (valve) stenosis: Secondary | ICD-10-CM

## 2019-10-16 DIAGNOSIS — I10 Essential (primary) hypertension: Secondary | ICD-10-CM

## 2019-10-16 DIAGNOSIS — R0989 Other specified symptoms and signs involving the circulatory and respiratory systems: Secondary | ICD-10-CM

## 2019-10-16 DIAGNOSIS — I6990 Unspecified sequelae of unspecified cerebrovascular disease: Secondary | ICD-10-CM

## 2019-10-16 DIAGNOSIS — I480 Paroxysmal atrial fibrillation: Secondary | ICD-10-CM | POA: Diagnosis not present

## 2019-10-16 DIAGNOSIS — I5032 Chronic diastolic (congestive) heart failure: Secondary | ICD-10-CM

## 2019-10-16 NOTE — Addendum Note (Signed)
Addended by: Lita Mains on: 10/16/2019 03:16 PM   Modules accepted: Orders

## 2019-10-16 NOTE — Progress Notes (Signed)
Cardiology Office Note:    Date:  10/16/2019   ID:  Kathryn Moody, DOB June 26, 1945, MRN [id number]  PCP:  Hortencia Conradi, NP  Cardiologist:  Gypsy Balsam, MD    Referring MD: Hortencia Conradi, NP   No chief complaint on file. I have shortness of breath  History of Present Illness:    Kathryn Moody is a 74 y.o. female with past medical history significant for paroxysmal atrial fibrillation, anticoagulated, history of CVA, essential hypertension, dyslipidemia, arctic stenosis which is mild based on last assessment.  Comes today to my office because she is complaining of shortness of breath.  She does not have paroxysmal nocturnal dyspnea, she does not have swelling swelling of lower extremities.  There is some cough but nonproductive.  She did not use any palpitation.  Past Medical History:  Diagnosis Date  . Aortic stenosis 12/05/2017  . Chronic diastolic congestive heart failure (HCC) 03/30/2015  . 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 Dyslipidemia 12/03/2014  . Essential hypertension 12/03/2014  . GI bleed 10/11/2016  . Late effects of CVA (cerebrovascular accident) 12/03/2014  . Left carotid bruit 03/07/2018  . Paroxysmal atrial fibrillation (HCC) 12/03/2014   Chads fast score equals 5, anticoagulated with Eliquis Chads fast score equals 5, anticoagulated with Eliquis      Current Medications: Current Meds  Medication Sig  . apixaban (ELIQUIS) 5 MG TABS tablet Take 1 tablet (5 mg total) by mouth 2 (two) times daily.  Marland Kitchen atorvastatin (LIPITOR) 20 MG tablet Take 1 tablet by mouth once daily  . Calcium Carb-Ergocalciferol 500-200 MG-UNIT TABS Take 1 tablet by mouth daily.  . carvedilol (COREG) 25 MG tablet Take 1 tablet by mouth 2 (two) times daily.  Marland Kitchen diltiazem (DILT-XR) 240 MG 24 hr capsule Take 2 tablets by mouth daily.   . fluticasone (FLONASE) 50 MCG/ACT nasal spray Place 1 spray into both nostrils daily.  . furosemide (LASIX) 40 MG tablet Take 120 mg  by mouth daily.  . furosemide (LASIX) 80 MG tablet Take 1 tablet by mouth daily.  . magnesium oxide (MAG-OX) 400 MG tablet Take 1 tablet by mouth daily.  . methimazole (TAPAZOLE) 10 MG tablet Take 1 tablet by mouth daily.  . pantoprazole (PROTONIX) 40 MG tablet Take 1 tablet by mouth daily.  . Potassium Chloride ER 20 MEQ TBCR Take 6 tablets by mouth daily.      Allergies:   Prednisone and Digoxin   Social History   Socioeconomic History  . Marital status: Married    Spouse name: Not on file  . Number of children: Not on file  . Years of education: Not on file  . Highest education level: Not on file  Occupational History  . Not on file  Tobacco Use  . Smoking status: Former Games developer  . Smokeless tobacco: Never Used  Vaping Use  . Vaping Use: Never used  Substance and Sexual Activity  . Alcohol use: Yes  . Drug use: Never  . Sexual activity: Not Currently  Other Topics Concern  . Not on file  Social History Narrative  . Not on file   Social Determinants of Health   Financial Resource Strain:   . Difficulty of Paying Living Expenses:   Food Insecurity:   . Worried About Programme researcher, broadcasting/film/video in the Last Year:   . Barista in the Last Year:   Transportation Needs:   . Freight forwarder (Medical):   Marland Kitchen Lack of Transportation (Non-Medical):  Physical Activity:   . Days of Exercise per Week:   . Minutes of Exercise per Session:   Stress:   . Feeling of Stress :   Social Connections:   . Frequency of Communication with Friends and Family:   . Frequency of Social Gatherings with Friends and Family:   . Attends Religious Services:   . Active Member of Clubs or Organizations:   . Attends Banker Meetings:   Marland Kitchen Marital Status:      Family History: The patient's family history includes Charcot-Marie-Tooth disease in her brother, daughter, and mother; Heart disease in her father; Thyroid disease in her mother. ROS:   Please see the history of  present illness.    All 14 point review of systems negative except as described per history of present illness  EKGs/Labs/Other Studies Reviewed:      Recent Labs: No results found for requested labs within last 8760 hours.  Recent Lipid Panel    Component Value Date/Time   CHOL 185 01/17/2018 0923   TRIG 74 01/17/2018 0923   HDL 83 01/17/2018 0923   CHOLHDL 2.2 01/17/2018 0923   LDLCALC 87 01/17/2018 0923    Physical Exam:    VS:  BP 126/70 (BP Location: Right Arm, Patient Position: Sitting, Cuff Size: Normal)   Pulse 59   Ht 5\' 4"  (1.626 m)   Wt 161 lb (73 kg)   SpO2 94%   BMI 27.64 kg/m     Wt Readings from Last 3 Encounters:  10/16/19 161 lb (73 kg)  04/16/19 173 lb (78.5 kg)  12/11/18 166 lb (75.3 kg)     GEN:  Well nourished, well developed in no acute distress HEENT: Normal NECK: No JVD; left carotid bruit on the right side LYMPHATICS: No lymphadenopathy CARDIAC: RRR, systolic ejection murmur grade 2/6 best heard right upper portion of the sternum, no rubs, no gallops RESPIRATORY:  Clear to auscultation without rales, wheezing or rhonchi  ABDOMEN: Soft, non-tender, non-distended MUSCULOSKELETAL:  No edema; No deformity  SKIN: Warm and dry LOWER EXTREMITIES: no swelling NEUROLOGIC:  Alert and oriented x 3 PSYCHIATRIC:  Normal affect   ASSESSMENT:    1. Paroxysmal atrial fibrillation (HCC)   2. Essential hypertension   3. Chronic diastolic congestive heart failure (HCC)   4. Nonrheumatic aortic valve stenosis   5. Late effects of CVA (cerebrovascular accident)   6. Left carotid bruit    PLAN:    In order of problems listed above:  1. Dyspnea exertion: New finding.  I will ask you to have proBNP as well as Chem-7 done.  That will help 12/13/18 determine if this is related to her congestive heart failure which is diastolic in nature.  I will also do EKG make sure she is not in atrial fibrillation however on physical exam she sounds very  regular. 2. Paroxysmal atrial fibrillation EKG will be done to check the rhythm.  She is on Eliquis anticoagulated which I will continue. 3. Aortic stenosis only mild based on last examination. 4. Carotic bruit appears to be quite loud on the right side.  I will ask you to have carotic ultrasound.   Medication Adjustments/Labs and Tests Ordered: Current medicines are reviewed at length with the patient today.  Concerns regarding medicines are outlined above.  No orders of the defined types were placed in this encounter.  Medication changes: No orders of the defined types were placed in this encounter.   Signed, [place], MD, [facility] 10/16/2019  1:58 PM    [facility]

## 2019-10-16 NOTE — Patient Instructions (Signed)
Medication Instructions:  Your physician recommends that you continue on your current medications as directed. Please refer to the Current Medication list given to you today.  *If you need a refill on your cardiac medications before your next appointment, please call your pharmacy*   Lab Work: Your physician recommends that you return for lab work today: bmp, pro bnp   If you have labs (blood work) drawn today and your tests are completely normal, you will receive your results only by: Marland Kitchen MyChart Message (if you have MyChart) OR . A paper copy in the mail If you have any lab test that is abnormal or we need to change your treatment, we will call you to review the results.   Testing/Procedures: Your physician has requested that you have a carotid duplex. This test is an ultrasound of the carotid arteries in your neck. It looks at blood flow through these arteries that supply the brain with blood. Allow one hour for this exam. There are no restrictions or special instructions.      Follow-Up: At [facility], you and your health needs are our priority.  As part of our continuing mission to provide you with exceptional heart care, we have created designated Provider Care Teams.  These Care Teams include your primary Cardiologist (physician) and Advanced Practice Providers (APPs -  Physician Assistants and Nurse Practitioners) who all work together to provide you with the care you need, when you need it.  We recommend signing up for the patient portal called "MyChart".  Sign up information is provided on this After Visit Summary.  MyChart is used to connect with patients for Virtual Visits (Telemedicine).  Patients are able to view lab/test results, encounter notes, upcoming appointments, etc.  Non-urgent messages can be sent to your provider as well.   To learn more about what you can do with MyChart, go to [email or web address].    Your next appointment:   3 month(s)  The format for  your next appointment:   In Person  Provider:   Gypsy Balsam, MD   Other Instructions

## 2019-10-16 NOTE — Addendum Note (Signed)
Addended by: Lita Mains on: 10/16/2019 02:30 PM   Modules accepted: Orders

## 2019-10-17 LAB — BASIC METABOLIC PANEL
BUN/Creatinine Ratio: 14 (ref 12–28)
BUN: 13 mg/dL (ref 8–27)
CO2: 27 mmol/L (ref 20–29)
Calcium: 9.7 mg/dL (ref 8.7–10.3)
Chloride: 99 mmol/L (ref 96–106)
Creatinine, Ser: 0.9 mg/dL (ref 0.57–1.00)
GFR calc Af Amer: 74 mL/min/{1.73_m2} (ref >59)
GFR calc non Af Amer: 64 mL/min/{1.73_m2} (ref >59)
Glucose: 97 mg/dL (ref 65–99)
Potassium: 4.1 mmol/L (ref 3.5–5.2)
Sodium: 142 mmol/L (ref 134–144)

## 2019-10-17 LAB — PRO B NATRIURETIC PEPTIDE: NT-Pro BNP: 168 pg/mL (ref 0–301)

## 2019-10-20 ENCOUNTER — Telehealth: Payer: Self-pay | Admitting: Emergency Medicine

## 2019-10-20 NOTE — Telephone Encounter (Signed)
-----   Message from Georgeanna Lea, MD sent at 10/20/2019  9:15 AM EDT ----- Based on laboratory test it looks like she does not have significant congestive heart failure, empirically we can try to increase diuretic but first let us try to find out exactly how much she is taking.

## 2019-10-20 NOTE — Telephone Encounter (Signed)
Called patient informed her of lab results. She takes 80 mg of lasix in the morning, 40 mg of laix at 2 pm and 80 mg of lasix at 4 pm daily. Will inform Dr. Bing Matter.

## 2019-10-27 IMAGING — CT CT VIRTUAL COLONOSCOPY DIAGNOSTIC
2 of 5 series · 15 of 46 positions shown, 17 images · non-contrast
Comparison: None.

CLINICAL DATA: GI bleeding on stool sample.

No charge study due to equipment failure, leading to incomplete
evaluation.
EXAM:
CT VIRTUAL COLONOSCOPY DIAGNOSTIC
TECHNIQUE: The patient was given a standard Mag citrate bowel preparation with
Gastrografin and barium for fluid and stool tagging respectively.
The quality of the bowel preparation is moderate. Automated CO2
insufflation of the colon was performed prior to image acquisition
and colonic distention is poor. Image post processing was used to
generate a 3D endoluminal fly-through projection of the colon and to
electronically subtract stool/fluid as appropriate.

[Series 5: supine colon 3.00 br40 s3 cor cor supine · coronal · 0.91mm/px · 3 of 88 slices shown]
[im 30/88  soft-tissue]
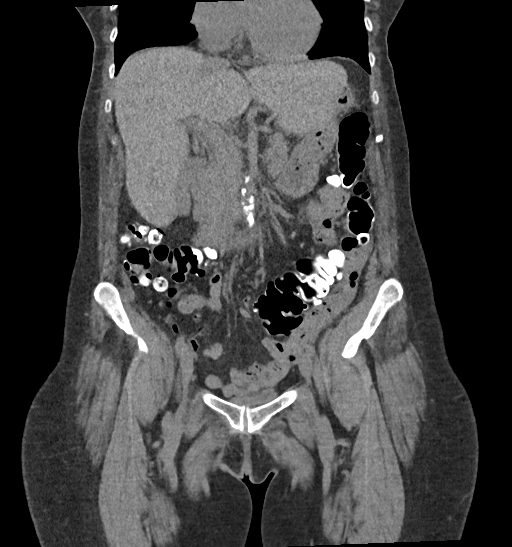
[im 39/88  soft-tissue]
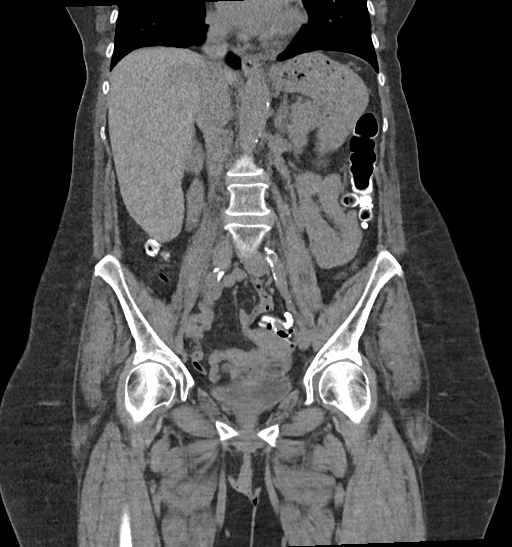
[im 49/88  soft-tissue]
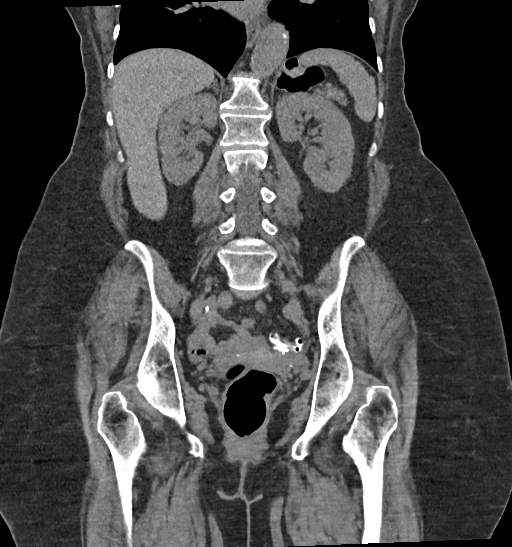

[Series 8: supine colon 1.50 br40 s3 supine thins · axial · 0.85mm/px · z∈[+1149,+1600]mm · 12 of 497 slices shown, 14 images]
[im 23/497  soft-tissue]
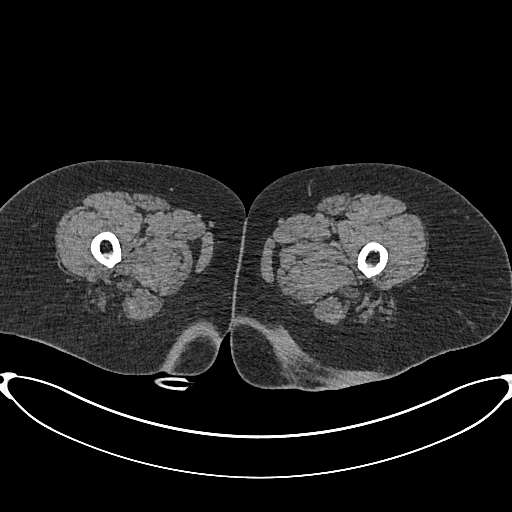
[im 23/497  bone]
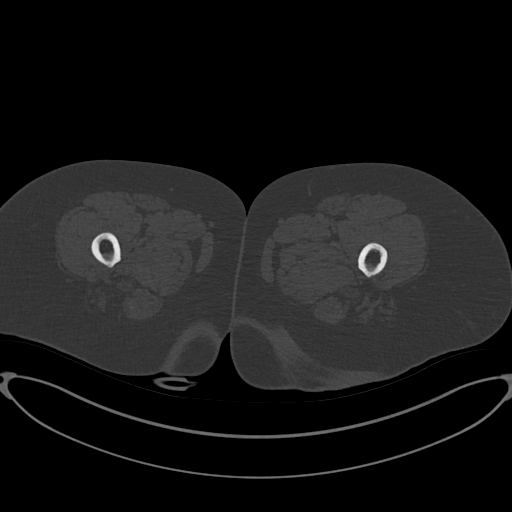
[im 68/497  soft-tissue]
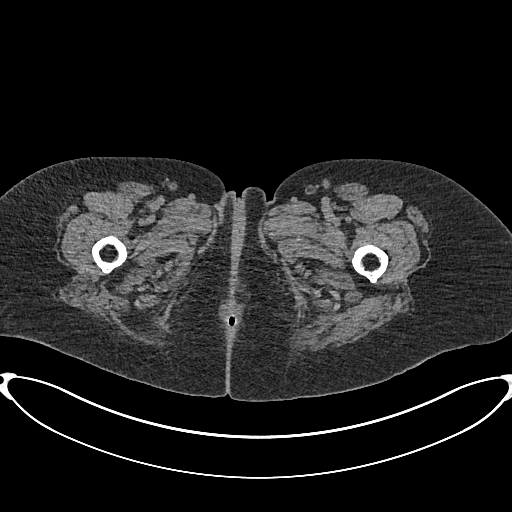
[im 113/497  soft-tissue]
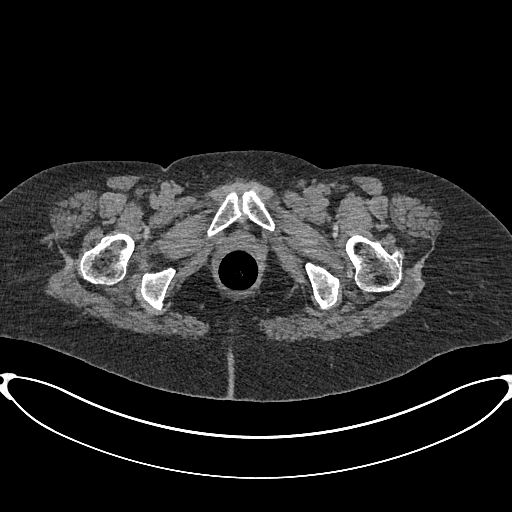
[im 158/497  soft-tissue]
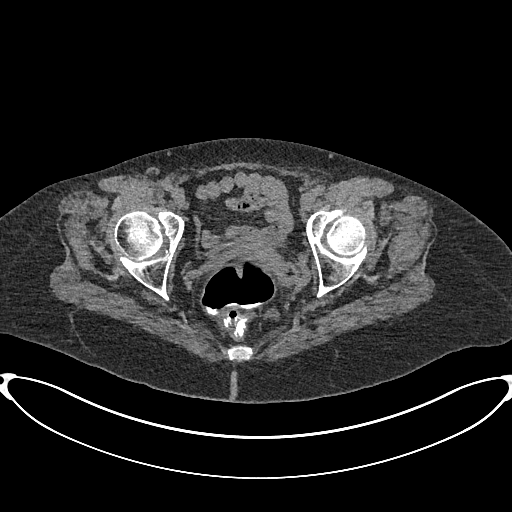
[im 181/497  soft-tissue]
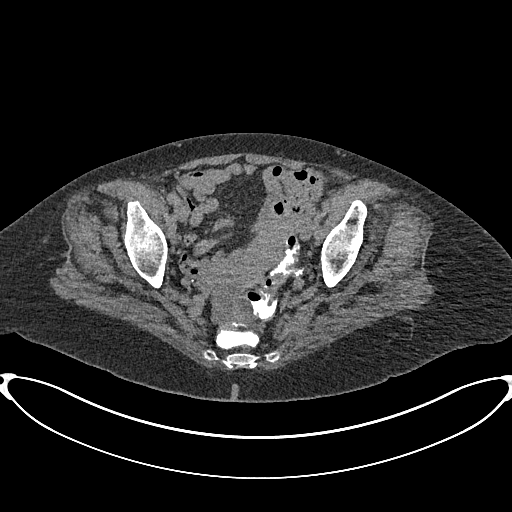
[im 226/497  soft-tissue]
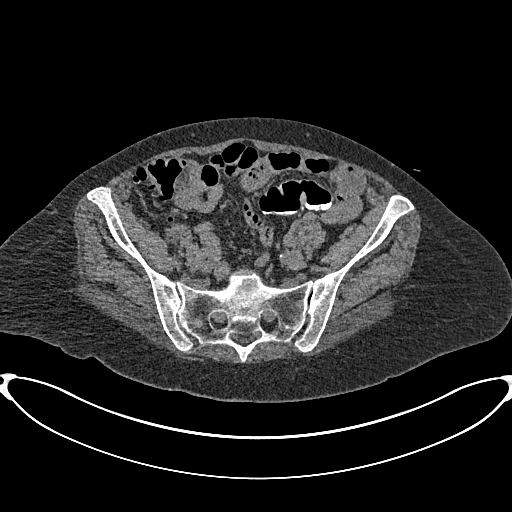
[im 271/497  soft-tissue]
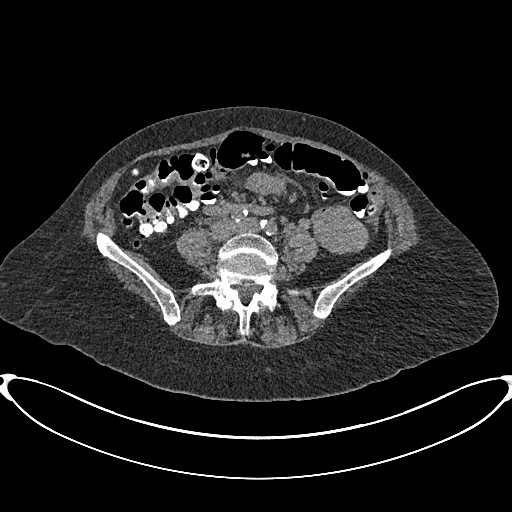
[im 316/497  soft-tissue]
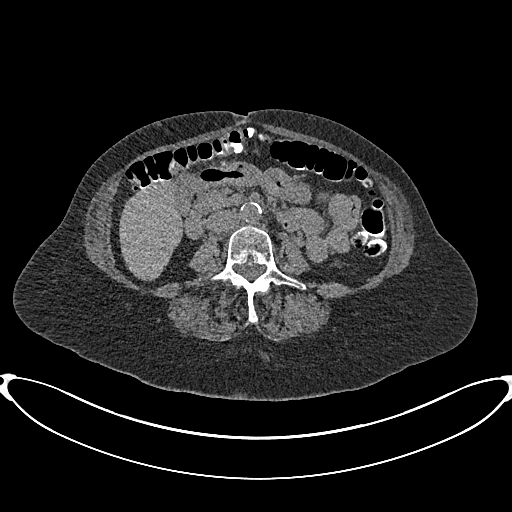
[im 339/497  soft-tissue]
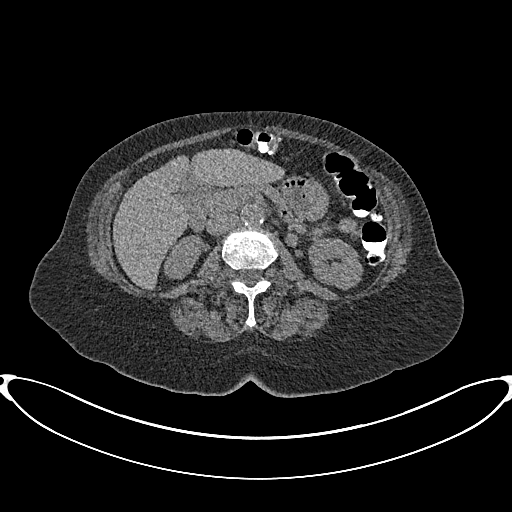
[im 339/497  bone]
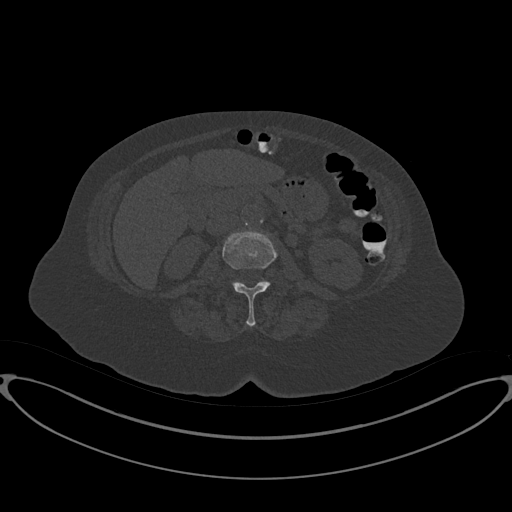
[im 384/497  soft-tissue]
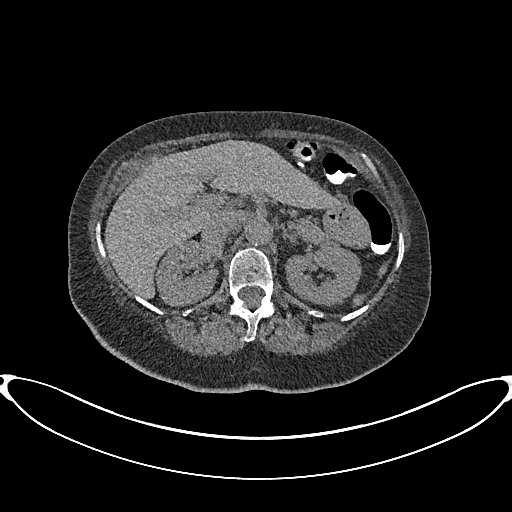
[im 429/497  soft-tissue]
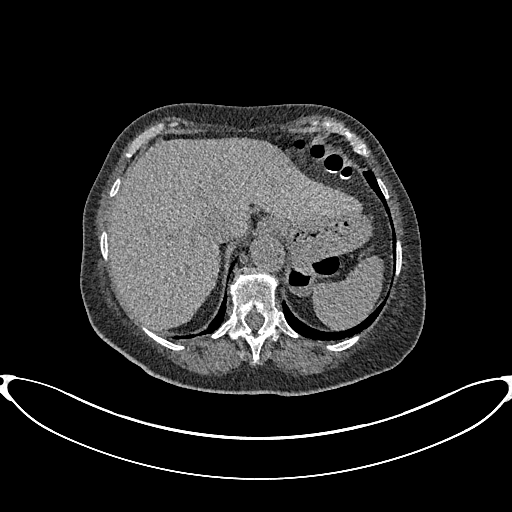
[im 474/497  soft-tissue]
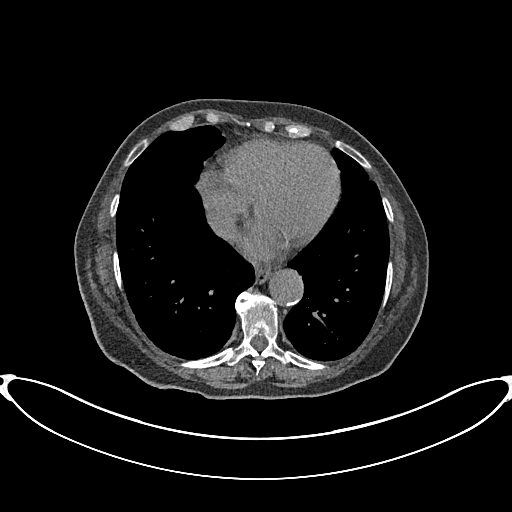

[15 of 46 positions shown; findings below may reference images not displayed]

FINDINGS: No charge study due to equipment failure, leading to incomplete
evaluation.

VIRTUAL COLONOSCOPY

Due to equipment failure, colon was only insufflated with 900 mL
CO2, rather than 2 L. equipment than shut down. Only a single supine
series was obtained, rather than full supine/prone (with optional
right lateral decubitus) evaluation.

On this single evaluation, the sigmoid colon is underdistended and
opacified with stool/contrast. Splenic flexure and transverse colon
is moderately distended. Ascending colon is suboptimally distended.

Within that dose constraints, there are no significant colonic
polyps, masses, apple core lesions, or strictures. However, the
sigmoid colon is likely inadequately evaluated.

Extensive colonic diverticulosis, particularly involving the right
colon. No evidence of diverticulitis.

No evidence of bowel obstruction. Normal appendix (series 7/image
85).

Virtual colonoscopy is not designed to detect diminutive polyps
(i.e., less than or equal to 5 mm), the presence or absence of which
may not affect clinical management.

CT ABDOMEN AND PELVIS WITHOUT CONTRAST

Lower chest: Lung bases are clear.

Hepatobiliary: Unenhanced liver is unremarkable.

Gallbladder is unremarkable. No intrahepatic or extrahepatic ductal
dilatation.

Pancreas: Within normal limits.

Spleen: Within normal limits.

Adrenals/Urinary Tract: Adrenal glands are within normal limits.

Kidneys are within normal limits. No renal, ureteral, or bladder
calculi. No hydronephrosis.

Bladder is mildly thick-walled although underdistended.

Stomach/Bowel: Stomach is notable for a large posterior gastric
diverticulum (series 7/image 25).

Bowel is described above.

Vascular/Lymphatic: No evidence of abdominal aortic aneurysm.
Atherosclerotic calcifications the abdominal aorta and branch
vessels.

No suspicious abdominopelvic lymphadenopathy.

Reproductive: Uterus is grossly unremarkable.

No adnexal masses.

Other: No abdominopelvic ascites.

Tiny fat containing left inguinal hernia (series 7/image 124).

Musculoskeletal: Very mild degenerative changes of the lower
thoracic spine.
IMPRESSION: No charge study due to equipment failure, leading to incomplete
evaluation.

Sigmoid colon is underdistended and inadequately evaluated.
Otherwise, there is no significant colonic polyp, mass, apple core
lesion, or stricture.

Extensive colonic diverticulosis, without evidence of
diverticulitis.

Additional ancillary findings as above.

## 2019-11-07 ENCOUNTER — Other Ambulatory Visit: Payer: Self-pay

## 2019-11-07 ENCOUNTER — Ambulatory Visit (INDEPENDENT_AMBULATORY_CARE_PROVIDER_SITE_OTHER): Payer: Medicare Other

## 2019-11-07 DIAGNOSIS — R0989 Other specified symptoms and signs involving the circulatory and respiratory systems: Secondary | ICD-10-CM

## 2019-11-07 NOTE — Progress Notes (Signed)
Carotid duplex exam has been performed.  Jimmy Heily Carlucci RDCS, RVT 

## 2020-01-28 ENCOUNTER — Other Ambulatory Visit: Payer: Self-pay

## 2020-01-28 ENCOUNTER — Encounter: Payer: Self-pay | Admitting: Cardiology

## 2020-01-28 ENCOUNTER — Ambulatory Visit (INDEPENDENT_AMBULATORY_CARE_PROVIDER_SITE_OTHER): Payer: Medicare Other | Admitting: Cardiology

## 2020-01-28 VITALS — BP 132/66 | HR 60 | Ht 64.0 in | Wt 163.8 lb

## 2020-01-28 DIAGNOSIS — I350 Nonrheumatic aortic (valve) stenosis: Secondary | ICD-10-CM

## 2020-01-28 DIAGNOSIS — E785 Hyperlipidemia, unspecified: Secondary | ICD-10-CM

## 2020-01-28 DIAGNOSIS — I6990 Unspecified sequelae of unspecified cerebrovascular disease: Secondary | ICD-10-CM

## 2020-01-28 DIAGNOSIS — I480 Paroxysmal atrial fibrillation: Secondary | ICD-10-CM

## 2020-01-28 DIAGNOSIS — I5032 Chronic diastolic (congestive) heart failure: Secondary | ICD-10-CM | POA: Diagnosis not present

## 2020-01-28 DIAGNOSIS — I10 Essential (primary) hypertension: Secondary | ICD-10-CM

## 2020-01-28 DIAGNOSIS — R0989 Other specified symptoms and signs involving the circulatory and respiratory systems: Secondary | ICD-10-CM

## 2020-01-28 NOTE — Progress Notes (Signed)
Cardiology Office Note:    Date:  01/28/2020   ID:  Kathryn Moody, DOB January 25, 1946, MRN [id number]  PCP:  Hortencia Conradi, NP  Cardiologist:  Gypsy Balsam, MD    Referring MD: Hortencia Conradi, NP   No chief complaint on file. I have shortness of breath slightly more than before  History of Present Illness:    Kathryn Moody is a 74 y.o. female with past medical history significant for paroxysmal atrial fibrillation, she is anticoagulated, history of CVA, essential hypertension, dyslipidemia, aortic stenosis which is only mild.  Comes today 2 months for follow-up she complained of having a little more shortness of breath than usual a, no paroxysmal nocturnal dyspnea there is some swelling of lower extremities especially at evening time, described to have some cough which is nonproductive.  No chest pain no palpitations no dizziness  Past Medical History:  Diagnosis Date  . Aortic stenosis 12/05/2017  . Chronic diastolic congestive heart failure (HCC) 03/30/2015  . 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 Dyslipidemia 12/03/2014  . Essential hypertension 12/03/2014  . GI bleed 10/11/2016  . Late effects of CVA (cerebrovascular accident) 12/03/2014  . Left carotid bruit 03/07/2018  . Paroxysmal atrial fibrillation (HCC) 12/03/2014   Chads fast score equals 5, anticoagulated with Eliquis Chads fast score equals 5, anticoagulated with Eliquis    Past Surgical History:  Procedure Laterality Date  . MOUTH SURGERY      Current Medications: Current Meds  Medication Sig  . apixaban (ELIQUIS) 5 MG TABS tablet Take 1 tablet (5 mg total) by mouth 2 (two) times daily.  Marland Kitchen atorvastatin (LIPITOR) 20 MG tablet Take 1 tablet by mouth once daily  . Calcium Carb-Ergocalciferol 500-200 MG-UNIT TABS Take 1 tablet by mouth daily.  . carvedilol (COREG) 25 MG tablet Take 1 tablet by mouth 2 (two) times daily.  Marland Kitchen diltiazem (DILT-XR) 240 MG 24 hr capsule Take 2 tablets by mouth daily.    . fluticasone (FLONASE) 50 MCG/ACT nasal spray Place 1 spray into both nostrils daily.  . furosemide (LASIX) 40 MG tablet Take 120 mg by mouth daily.  . furosemide (LASIX) 80 MG tablet Take 1 tablet by mouth daily.  . magnesium oxide (MAG-OX) 400 MG tablet Take 1 tablet by mouth daily.  . methimazole (TAPAZOLE) 10 MG tablet Take 1 tablet by mouth daily.  . pantoprazole (PROTONIX) 40 MG tablet Take 1 tablet by mouth daily.  . Potassium Chloride ER 20 MEQ TBCR Take 6 tablets by mouth daily.      Allergies:   Prednisone and Digoxin   Social History   Socioeconomic History  . Marital status: Married    Spouse name: Not on file  . Number of children: Not on file  . Years of education: Not on file  . Highest education level: Not on file  Occupational History  . Not on file  Tobacco Use  . Smoking status: Former Games developer  . Smokeless tobacco: Never Used  Vaping Use  . Vaping Use: Never used  Substance and Sexual Activity  . Alcohol use: Yes  . Drug use: Never  . Sexual activity: Not Currently  Other Topics Concern  . Not on file  Social History Narrative  . Not on file   Social Determinants of Health   Financial Resource Strain:   . Difficulty of Paying Living Expenses: Not on file  Food Insecurity:   . Worried About Programme researcher, broadcasting/film/video in the Last Year: Not on file  .  Ran Out of Food in the Last Year: Not on file  Transportation Needs:   . Lack of Transportation (Medical): Not on file  . Lack of Transportation (Non-Medical): Not on file  Physical Activity:   . Days of Exercise per Week: Not on file  . Minutes of Exercise per Session: Not on file  Stress:   . Feeling of Stress : Not on file  Social Connections:   . Frequency of Communication with Friends and Family: Not on file  . Frequency of Social Gatherings with Friends and Family: Not on file  . Attends Religious Services: Not on file  . Active Member of Clubs or Organizations: Not on file  . Attends Tax inspector Meetings: Not on file  . Marital Status: Not on file     Family History: The patient's family history includes Charcot-Marie-Tooth disease in her brother, daughter, and mother; Heart disease in her father; Thyroid disease in her mother. ROS:   Please see the history of present illness.    All 14 point review of systems negative except as described per history of present illness  EKGs/Labs/Other Studies Reviewed:      Recent Labs: 10/16/2019: BUN 13; Creatinine, Ser 0.90; NT-Pro BNP 168; Potassium 4.1; Sodium 142  Recent Lipid Panel    Component Value Date/Time   CHOL 185 01/17/2018 0923   TRIG 74 01/17/2018 0923   HDL 83 01/17/2018 0923   CHOLHDL 2.2 01/17/2018 0923   LDLCALC 87 01/17/2018 0923    Physical Exam:    VS:  BP 132/66   Pulse 60   Ht 5\' 4"  (1.626 m)   Wt 163 lb 12.8 oz (74.3 kg)   SpO2 95%   BMI 28.12 kg/m     Wt Readings from Last 3 Encounters:  01/28/20 163 lb 12.8 oz (74.3 kg)  10/16/19 161 lb (73 kg)  04/16/19 173 lb (78.5 kg)     GEN:  Well nourished, well developed in no acute distress HEENT: Normal NECK: No JVD; No carotid bruits LYMPHATICS: No lymphadenopathy CARDIAC: RRR, systolic ejection murmur grade 2/6 to 3/6 best heard right upper portion of the sternum, no rubs, no gallops RESPIRATORY:  Clear to auscultation without rales, wheezing or rhonchi  ABDOMEN: Soft, non-tender, non-distended MUSCULOSKELETAL:  No edema; No deformity  SKIN: Warm and dry LOWER EXTREMITIES: no swelling NEUROLOGIC:  Alert and oriented x 3 PSYCHIATRIC:  Normal affect   ASSESSMENT:    1. Paroxysmal atrial fibrillation (HCC)   2. Essential hypertension   3. Chronic diastolic congestive heart failure (HCC)   4. Nonrheumatic aortic valve stenosis   5. Late effects of CVA (cerebrovascular accident)   6. Dyslipidemia   7. Left carotid bruit    PLAN:    In order of problems listed above:  1. Paroxysmal atrial fibrillation rate seems to be regular  today.  We will continue present medication which include anticoagulation. 2. Essential hypertension blood pressure well controlled continue present management. 3. Dyspnea on exertion which is slightly worse I will check a proBNP today as well as Chem-7, echocardiogram will be done to reassess left ventricle ejection fraction, EKG will be done to check rhythm. 4. Diastolic congestive heart failure clinically she looks fairly compensated.  Will look for subclinical CHF and then treat appropriately. 5. Aortic stenosis was only mild in January I would be very surprised if valve progressed to significant level but this is something need to be rechecked with echocardiogram. 6. Dyslipidemia: I did review K PN  sadly I have data only from 2019 showing LDL of 87.  She is on Lipitor 20 and I will contact her primary care physician to get fasting lipid profile.   Medication Adjustments/Labs and Tests Ordered: Current medicines are reviewed at length with the patient today.  Concerns regarding medicines are outlined above.  No orders of the defined types were placed in this encounter.  Medication changes: No orders of the defined types were placed in this encounter.   Signed, Georgeanna Lea, MD, [facility] 01/28/2020 2:28 PM    [facility]

## 2020-01-28 NOTE — Patient Instructions (Signed)
Medication Instructions:  Your physician recommends that you continue on your current medications as directed. Please refer to the Current Medication list given to you today.  *If you need a refill on your cardiac medications before your next appointment, please call your pharmacy*   Lab Work: Your physician recommends that you return for lab work today: bmp, pro bnp   If you have labs (blood work) drawn today and your tests are completely normal, you will receive your results only by: . MyChart Message (if you have MyChart) OR . A paper copy in the mail If you have any lab test that is abnormal or we need to change your treatment, we will call you to review the results.   Testing/Procedures: Your physician has requested that you have an echocardiogram. Echocardiography is a painless test that uses sound waves to create images of your heart. It provides your doctor with information about the size and shape of your heart and how well your heart's chambers and valves are working. This procedure takes approximately one hour. There are no restrictions for this procedure.     Follow-Up: At [facility], you and your health needs are our priority.  As part of our continuing mission to provide you with exceptional heart care, we have created designated Provider Care Teams.  These Care Teams include your primary Cardiologist (physician) and Advanced Practice Providers (APPs -  Physician Assistants and Nurse Practitioners) who all work together to provide you with the care you need, when you need it.  We recommend signing up for the patient portal called "MyChart".  Sign up information is provided on this After Visit Summary.  MyChart is used to connect with patients for Virtual Visits (Telemedicine).  Patients are able to view lab/test results, encounter notes, upcoming appointments, etc.  Non-urgent messages can be sent to your provider as well.   To learn more about what you can do with MyChart,  go to [email or web address].    Your next appointment:   3 month(s)  The format for your next appointment:   In Person  Provider:   Robert Krasowski, MD   Other Instructions   Echocardiogram An echocardiogram is a procedure that uses painless sound waves (ultrasound) to produce an image of the heart. Images from an echocardiogram can provide important information about:  Signs of coronary artery disease (CAD).  Aneurysm detection. An aneurysm is a weak or damaged part of an artery wall that bulges out from the normal force of blood pumping through the body.  Heart size and shape. Changes in the size or shape of the heart can be associated with certain conditions, including heart failure, aneurysm, and CAD.  Heart muscle function.  Heart valve function.  Signs of a past heart attack.  Fluid buildup around the heart.  Thickening of the heart muscle.  A tumor or infectious growth around the heart valves. Tell a health care provider about:  Any allergies you have.  All medicines you are taking, including vitamins, herbs, eye drops, creams, and over-the-counter medicines.  Any blood disorders you have.  Any surgeries you have had.  Any medical conditions you have.  Whether you are pregnant or may be pregnant. What are the risks? Generally, this is a safe procedure. However, problems may occur, including:  Allergic reaction to dye (contrast) that may be used during the procedure. What happens before the procedure? No specific preparation is needed. You may eat and drink normally. What happens during the procedure?   An   IV tube may be inserted into one of your veins.  You may receive contrast through this tube. A contrast is an injection that improves the quality of the pictures from your heart.  A gel will be applied to your chest.  A wand-like tool (transducer) will be moved over your chest. The gel will help to transmit the sound waves from the  transducer.  The sound waves will harmlessly bounce off of your heart to allow the heart images to be captured in real-time motion. The images will be recorded on a computer. The procedure may vary among health care providers and hospitals. What happens after the procedure?  You may return to your normal, everyday life, including diet, activities, and medicines, unless your health care provider tells you not to do that. Summary  An echocardiogram is a procedure that uses painless sound waves (ultrasound) to produce an image of the heart.  Images from an echocardiogram can provide important information about the size and shape of your heart, heart muscle function, heart valve function, and fluid buildup around your heart.  You do not need to do anything to prepare before this procedure. You may eat and drink normally.  After the echocardiogram is completed, you may return to your normal, everyday life, unless your health care provider tells you not to do that. This information is not intended to replace advice given to you by your health care provider. Make sure you discuss any questions you have with your health care provider. Document Revised: 07/04/2018 Document Reviewed: 04/15/2016 Elsevier Patient Education  2020 Elsevier Inc.   

## 2020-01-28 NOTE — Addendum Note (Signed)
Addended by: Hazle Quant on: 01/28/2020 02:49 PM   Modules accepted: Orders

## 2020-01-29 LAB — BASIC METABOLIC PANEL
BUN/Creatinine Ratio: 16 (ref 12–28)
BUN: 14 mg/dL (ref 8–27)
CO2: 27 mmol/L (ref 20–29)
Calcium: 9.5 mg/dL (ref 8.7–10.3)
Chloride: 100 mmol/L (ref 96–106)
Creatinine, Ser: 0.89 mg/dL (ref 0.57–1.00)
GFR calc Af Amer: 75 mL/min/{1.73_m2} (ref >59)
GFR calc non Af Amer: 65 mL/min/{1.73_m2} (ref >59)
Glucose: 97 mg/dL (ref 65–99)
Potassium: 4.2 mmol/L (ref 3.5–5.2)
Sodium: 142 mmol/L (ref 134–144)

## 2020-01-29 LAB — PRO B NATRIURETIC PEPTIDE: NT-Pro BNP: 207 pg/mL (ref 0–301)

## 2020-02-27 ENCOUNTER — Other Ambulatory Visit: Payer: Medicare Other

## 2020-04-05 ENCOUNTER — Other Ambulatory Visit: Payer: Self-pay | Admitting: Cardiology

## 2020-04-13 ENCOUNTER — Other Ambulatory Visit: Payer: Medicare Other

## 2020-04-27 ENCOUNTER — Other Ambulatory Visit: Payer: Self-pay

## 2020-04-29 ENCOUNTER — Other Ambulatory Visit: Payer: Self-pay

## 2020-04-29 ENCOUNTER — Encounter: Payer: Self-pay | Admitting: Cardiology

## 2020-04-29 ENCOUNTER — Ambulatory Visit (INDEPENDENT_AMBULATORY_CARE_PROVIDER_SITE_OTHER): Payer: Medicare Other | Admitting: Cardiology

## 2020-04-29 VITALS — BP 130/68 | HR 60 | Ht 64.0 in | Wt 168.0 lb

## 2020-04-29 DIAGNOSIS — I480 Paroxysmal atrial fibrillation: Secondary | ICD-10-CM | POA: Diagnosis not present

## 2020-04-29 DIAGNOSIS — I10 Essential (primary) hypertension: Secondary | ICD-10-CM | POA: Diagnosis not present

## 2020-04-29 DIAGNOSIS — I350 Nonrheumatic aortic (valve) stenosis: Secondary | ICD-10-CM | POA: Diagnosis not present

## 2020-04-29 DIAGNOSIS — I6990 Unspecified sequelae of unspecified cerebrovascular disease: Secondary | ICD-10-CM

## 2020-04-29 DIAGNOSIS — I5032 Chronic diastolic (congestive) heart failure: Secondary | ICD-10-CM

## 2020-04-29 NOTE — Patient Instructions (Signed)

## 2020-04-29 NOTE — Progress Notes (Signed)
Cardiology Office Note:    Date:  04/29/2020   ID:  [name], DOB 20-Mar-1946, MRN [id number]  PCP:  Hortencia Conradi, NP  Cardiologist:  Gypsy Balsam, MD    Referring MD: Hortencia Conradi, NP   Chief Complaint  Patient presents with  . Follow-up  Still short of breath  History of Present Illness:    Kathryn Moody is a 75 y.o. female past medical history significant for paroxysmal atrial fibrillation, she is anticoagulated, history of CVA, essential hypertension, dyslipidemia, mild aortic stenosis.  Last time seen her about 3 months ago when she was complaining of having shortness of breath.  I did proBNP which was normal, she is also scheduled to have an echocardiogram however it did not happen yet.  She will have echocardiogram done within the next few days.  Still complain of having some shortness of breath she thinks this is related to her lungs and that she may be right.  She denies having swelling of lower extremities, no proximal nocturnal dyspnea.  Past Medical History:  Diagnosis Date  . Aortic stenosis 12/05/2017  . Chronic diastolic congestive heart failure (HCC) 03/30/2015  . 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 Dyslipidemia 12/03/2014  . Essential hypertension 12/03/2014  . GI bleed 10/11/2016  . Hyperthyroidism 06/06/2019  . Late effects of CVA (cerebrovascular accident) 12/03/2014  . Left carotid bruit 03/07/2018  . 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 2.7 cm nodule on right lobe and 2.0 cm nodule on left lobe.  . Paroxysmal atrial fibrillation (HCC) 12/03/2014   Chads fast score equals 5, anticoagulated with Eliquis Chads fast score equals 5, anticoagulated with Eliquis    Past Surgical History:  Procedure Laterality Date  . MOUTH SURGERY      Current Medications: Current Meds  Medication Sig  . albuterol (VENTOLIN HFA) 108 (90 Base) MCG/ACT inhaler Inhale 1 puff into the lungs  as needed for wheezing or shortness of breath.  Marland Kitchen apixaban (ELIQUIS) 5 MG TABS tablet Take 1 tablet (5 mg total) by mouth 2 (two) times daily.  Marland Kitchen atorvastatin (LIPITOR) 20 MG tablet Take 1 tablet by mouth once daily  . B Complex Vitamins (VITAMIN B COMPLEX) TABS Take 1 tablet by mouth daily.  . Calcium Carb-Ergocalciferol 500-200 MG-UNIT TABS Take 1 tablet by mouth daily.  . carvedilol (COREG) 25 MG tablet Take 1 tablet by mouth 2 (two) times daily.  Marland Kitchen diltiazem (CARDIZEM CD) 240 MG 24 hr capsule Take 1 capsule by mouth 2 (two) times daily.  . fluticasone (FLONASE) 50 MCG/ACT nasal spray Place 1 spray into both nostrils daily.  . furosemide (LASIX) 40 MG tablet Take 120 mg by mouth daily.  . furosemide (LASIX) 80 MG tablet Take 1 tablet by mouth daily.  . magnesium oxide (MAG-OX) 400 MG tablet Take 1 tablet by mouth daily.  . methimazole (TAPAZOLE) 10 MG tablet Take 1 tablet by mouth daily.  . pantoprazole (PROTONIX) 40 MG tablet Take 1 tablet by mouth daily.  . Potassium Chloride ER 20 MEQ TBCR Take 6 tablets by mouth daily.      Allergies:   Prednisone and Digoxin   Social History   Socioeconomic History  . Marital status: Married    Spouse name: Not on file  . Number of children: Not on file  . Years of education: Not on file  . Highest education level: Not on file  Occupational History  . Not on  file  Tobacco Use  . Smoking status: Former Games developer  . Smokeless tobacco: Never Used  Vaping Use  . Vaping Use: Never used  Substance and Sexual Activity  . Alcohol use: Yes  . Drug use: Never  . Sexual activity: Not Currently  Other Topics Concern  . Not on file  Social History Narrative  . Not on file   Social Determinants of Health   Financial Resource Strain: Not on file  Food Insecurity: Not on file  Transportation Needs: Not on file  Physical Activity: Not on file  Stress: Not on file  Social Connections: Not on file     Family History: The patient's family  history includes Charcot-Marie-Tooth disease in her brother, daughter, and mother; Heart disease in her father; Thyroid disease in her mother. ROS:   Please see the history of present illness.    All 14 point review of systems negative except as described per history of present illness  EKGs/Labs/Other Studies Reviewed:      Recent Labs: 01/28/2020: BUN 14; Creatinine, Ser 0.89; NT-Pro BNP 207; Potassium 4.2; Sodium 142  Recent Lipid Panel    Component Value Date/Time   CHOL 185 01/17/2018 0923   TRIG 74 01/17/2018 0923   HDL 83 01/17/2018 0923   CHOLHDL 2.2 01/17/2018 0923   LDLCALC 87 01/17/2018 0923    Physical Exam:    VS:  BP 130/68 (BP Location: Right Arm, Patient Position: Sitting, Cuff Size: Normal)   Pulse 60   Ht 5\' 4"  (1.626 m)   Wt 168 lb (76.2 kg)   SpO2 95%   BMI 28.84 kg/m     Wt Readings from Last 3 Encounters:  04/29/20 168 lb (76.2 kg)  01/28/20 163 lb 12.8 oz (74.3 kg)  10/16/19 161 lb (73 kg)     GEN:  Well nourished, well developed in no acute distress HEENT: Normal NECK: No JVD; No carotid bruits LYMPHATICS: No lymphadenopathy CARDIAC: RRR, no murmurs, no rubs, no gallops RESPIRATORY:  Clear to auscultation without rales, wheezing or rhonchi  ABDOMEN: Soft, non-tender, non-distended MUSCULOSKELETAL:  No edema; No deformity  SKIN: Warm and dry LOWER EXTREMITIES: no swelling NEUROLOGIC:  Alert and oriented x 3 PSYCHIATRIC:  Normal affect   ASSESSMENT:    1. Paroxysmal atrial fibrillation (HCC)   2. Essential hypertension   3. Chronic diastolic congestive heart failure (HCC)   4. Nonrheumatic aortic valve stenosis   5. Late effects of CVA (cerebrovascular accident)    PLAN:    In order of problems listed above:  1. Paroxysmal atrial fibrillation.  Seems to be doing well from that point review denies have any palpitations continue present management. 2. Essential hypertension: Blood pressure well controlled today continue present  management. 3. Diastolic congestive heart failure so far proBNP is normal however she is scheduled to have echocardiogram will wait for results of this to decide about potential therapy for it.  So far I do not see indications for augmenting this therapy. 4. Dyslipidemia her LDL is 87 HDL 83 this is from K PN from October 24.  She is on Lipitor 20 which I will continue.   Medication Adjustments/Labs and Tests Ordered: Current medicines are reviewed at length with the patient today.  Concerns regarding medicines are outlined above.  No orders of the defined types were placed in this encounter.  Medication changes: No orders of the defined types were placed in this encounter.   Signed, October 26, MD, [facility] 04/29/2020 3:04 PM  [facility]

## 2020-05-07 ENCOUNTER — Ambulatory Visit (INDEPENDENT_AMBULATORY_CARE_PROVIDER_SITE_OTHER): Payer: Medicare Other

## 2020-05-07 ENCOUNTER — Other Ambulatory Visit: Payer: Self-pay

## 2020-05-07 DIAGNOSIS — I350 Nonrheumatic aortic (valve) stenosis: Secondary | ICD-10-CM

## 2020-05-07 DIAGNOSIS — I480 Paroxysmal atrial fibrillation: Secondary | ICD-10-CM | POA: Diagnosis not present

## 2020-05-07 DIAGNOSIS — I5032 Chronic diastolic (congestive) heart failure: Secondary | ICD-10-CM | POA: Diagnosis not present

## 2020-05-07 DIAGNOSIS — I6990 Unspecified sequelae of unspecified cerebrovascular disease: Secondary | ICD-10-CM

## 2020-05-07 DIAGNOSIS — E785 Hyperlipidemia, unspecified: Secondary | ICD-10-CM

## 2020-05-07 DIAGNOSIS — I10 Essential (primary) hypertension: Secondary | ICD-10-CM | POA: Diagnosis not present

## 2020-05-07 DIAGNOSIS — R0989 Other specified symptoms and signs involving the circulatory and respiratory systems: Secondary | ICD-10-CM

## 2020-05-07 LAB — ECHOCARDIOGRAM COMPLETE
AR max vel: 1.4 cm2
AV Area VTI: 1.34 cm2
AV Area mean vel: 1.28 cm2
AV Mean grad: 19.5 mmHg
AV Peak grad: 35.9 mmHg
Ao pk vel: 3 m/s
Area-P 1/2: 4.39 cm2
S' Lateral: 2.9 cm

## 2020-05-07 NOTE — Progress Notes (Signed)
Complete echocardiogram performed.  Jimmy Josyah Achor RDCS, RVT  

## 2020-07-06 ENCOUNTER — Other Ambulatory Visit: Payer: Self-pay | Admitting: Cardiology

## 2020-07-06 NOTE — Telephone Encounter (Signed)
Refill sent to pharmacy.   

## 2020-07-30 DIAGNOSIS — E782 Mixed hyperlipidemia: Secondary | ICD-10-CM

## 2020-07-30 DIAGNOSIS — Z7901 Long term (current) use of anticoagulants: Secondary | ICD-10-CM

## 2020-07-30 DIAGNOSIS — J449 Chronic obstructive pulmonary disease, unspecified: Secondary | ICD-10-CM

## 2020-07-30 DIAGNOSIS — R011 Cardiac murmur, unspecified: Secondary | ICD-10-CM

## 2020-07-30 DIAGNOSIS — L02419 Cutaneous abscess of limb, unspecified: Secondary | ICD-10-CM

## 2020-07-30 DIAGNOSIS — Z23 Encounter for immunization: Secondary | ICD-10-CM

## 2020-07-30 DIAGNOSIS — K649 Unspecified hemorrhoids: Secondary | ICD-10-CM

## 2020-07-30 DIAGNOSIS — I69391 Dysphagia following cerebral infarction: Secondary | ICD-10-CM

## 2020-07-30 DIAGNOSIS — E119 Type 2 diabetes mellitus without complications: Secondary | ICD-10-CM | POA: Insufficient documentation

## 2020-07-30 DIAGNOSIS — M25519 Pain in unspecified shoulder: Secondary | ICD-10-CM

## 2020-07-30 DIAGNOSIS — D518 Other vitamin B12 deficiency anemias: Secondary | ICD-10-CM

## 2020-07-30 DIAGNOSIS — M150 Primary generalized (osteo)arthritis: Secondary | ICD-10-CM

## 2020-07-30 DIAGNOSIS — K219 Gastro-esophageal reflux disease without esophagitis: Secondary | ICD-10-CM | POA: Insufficient documentation

## 2020-07-30 DIAGNOSIS — H6120 Impacted cerumen, unspecified ear: Secondary | ICD-10-CM

## 2020-07-30 DIAGNOSIS — M79609 Pain in unspecified limb: Secondary | ICD-10-CM

## 2020-07-30 DIAGNOSIS — R300 Dysuria: Secondary | ICD-10-CM

## 2020-07-30 DIAGNOSIS — E559 Vitamin D deficiency, unspecified: Secondary | ICD-10-CM

## 2020-07-30 DIAGNOSIS — Z01810 Encounter for preprocedural cardiovascular examination: Secondary | ICD-10-CM | POA: Insufficient documentation

## 2020-07-30 DIAGNOSIS — N390 Urinary tract infection, site not specified: Secondary | ICD-10-CM

## 2020-07-30 HISTORY — DX: Dysphagia following cerebral infarction: I69.391

## 2020-07-30 HISTORY — DX: Dysuria: R30.0

## 2020-07-30 HISTORY — DX: Impacted cerumen, unspecified ear: H61.20

## 2020-07-30 HISTORY — DX: Cardiac murmur, unspecified: R01.1

## 2020-07-30 HISTORY — DX: Type 2 diabetes mellitus without complications: E11.9

## 2020-07-30 HISTORY — DX: Other vitamin B12 deficiency anemias: D51.8

## 2020-07-30 HISTORY — DX: Pain in unspecified limb: M79.609

## 2020-07-30 HISTORY — DX: Pain in unspecified shoulder: M25.519

## 2020-07-30 HISTORY — DX: Cutaneous abscess of limb, unspecified: L02.419

## 2020-07-30 HISTORY — DX: Urinary tract infection, site not specified: N39.0

## 2020-07-30 HISTORY — DX: Unspecified hemorrhoids: K64.9

## 2020-07-30 HISTORY — DX: Chronic obstructive pulmonary disease, unspecified: J44.9

## 2020-07-30 HISTORY — DX: Mixed hyperlipidemia: E78.2

## 2020-07-30 HISTORY — DX: Encounter for immunization: Z23

## 2020-07-30 HISTORY — DX: Gastro-esophageal reflux disease without esophagitis: K21.9

## 2020-07-30 HISTORY — DX: Long term (current) use of anticoagulants: Z79.01

## 2020-07-30 HISTORY — DX: Primary generalized (osteo)arthritis: M15.0

## 2020-07-30 HISTORY — DX: Vitamin D deficiency, unspecified: E55.9

## 2020-08-04 ENCOUNTER — Other Ambulatory Visit: Payer: Self-pay

## 2020-08-04 ENCOUNTER — Ambulatory Visit (INDEPENDENT_AMBULATORY_CARE_PROVIDER_SITE_OTHER): Payer: Medicare Other | Admitting: Cardiology

## 2020-08-04 ENCOUNTER — Encounter: Payer: Self-pay | Admitting: Cardiology

## 2020-08-04 VITALS — BP 132/74 | HR 56 | Ht 64.0 in | Wt 171.0 lb

## 2020-08-04 DIAGNOSIS — E785 Hyperlipidemia, unspecified: Secondary | ICD-10-CM

## 2020-08-04 DIAGNOSIS — I5022 Chronic systolic (congestive) heart failure: Secondary | ICD-10-CM

## 2020-08-04 DIAGNOSIS — I480 Paroxysmal atrial fibrillation: Secondary | ICD-10-CM

## 2020-08-04 DIAGNOSIS — I10 Essential (primary) hypertension: Secondary | ICD-10-CM | POA: Diagnosis not present

## 2020-08-04 DIAGNOSIS — J449 Chronic obstructive pulmonary disease, unspecified: Secondary | ICD-10-CM | POA: Diagnosis not present

## 2020-08-04 NOTE — Addendum Note (Signed)
Addended by: Hazle Quant on: 08/04/2020 02:21 PM   Modules accepted: Orders

## 2020-08-04 NOTE — Progress Notes (Signed)
Cardiology Office Note:    Date:  08/04/2020   ID:  [name], DOB Aug 26, 1945, MRN [id number]  PCP:  Hortencia Conradi, NP  Cardiologist:  Gypsy Balsam, MD    Referring MD: Hortencia Conradi, NP   Chief Complaint  Patient presents with  . Follow-up  I am doing fine still have some swelling of lower extremities  History of Present Illness:    Kathryn Moody is a 75 y.o. female past medical history significant for paroxysmal atrial fibrillation, she is anticoagulated, history of CVA, essential hypertension, dyslipidemia, mild aortic stenosis Comes today to my office for follow-up overall she is doing fine.  She still gets some swelling of lower extremities.  That happens but she had evening time.  Described to have some shortness of breath as well.  She still gets some difficulty speaking this is a sequela of her CVA.  She does have some difficulty moving around she walks slowly with help of cane.  Past Medical History:  Diagnosis Date  . Abscess of upper limb 07/30/2020  . Aortic stenosis 12/05/2017  . Atrial fibrillation (HCC) 12/03/2014   Chads fast score equals 5, anticoagulated with Eliquis Chads fast score equals 5, anticoagulated with Eliquis  . Cardiac murmur, unspecified 07/30/2020  . Chronic diastolic congestive heart failure (HCC) 03/30/2015  . Chronic obstructive pulmonary disease (HCC) 07/30/2020  . Chronic systolic heart failure (HCC) 03/30/2015  . 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 Dyslipidemia 12/03/2014  . 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 Dysuria 07/30/2020  . Encounter for immunization 07/30/2020  . Essential hypertension 12/03/2014  . Gastro-esophageal reflux disease without esophagitis 07/30/2020  . GI bleed 10/11/2016  . Hemorrhoids 07/30/2020  . Hyperthyroidism 06/06/2019  . Hypothyroidism 06/06/2019  . Impacted cerumen 07/30/2020  . Late effects of CVA (cerebrovascular accident) 12/03/2014  . Left carotid bruit 03/07/2018  . Long  term (current) use of anticoagulants 07/30/2020  . Mixed hyperlipidemia 07/30/2020  . 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 2.7 cm nodule on right lobe and 2.0 cm nodule on left lobe.  . Other vitamin B12 deficiency anemias 07/30/2020  . Pain in limb 07/30/2020  . Paroxysmal atrial fibrillation (HCC) 12/03/2014   Chads fast score equals 5, anticoagulated with Eliquis Chads fast score equals 5, anticoagulated with Eliquis  . Primary generalized (osteo)arthritis 07/30/2020  . Shoulder joint pain 07/30/2020  . Type 2 diabetes mellitus without complications (HCC) 07/30/2020  . Urinary tract infectious disease 07/30/2020  . Vitamin D deficiency 07/30/2020    Past Surgical History:  Procedure Laterality Date  . MOUTH SURGERY      Current Medications: Current Meds  Medication Sig  . albuterol (VENTOLIN HFA) 108 (90 Base) MCG/ACT inhaler Inhale 1 puff into the lungs as needed for wheezing or shortness of breath.  Marland Kitchen apixaban (ELIQUIS) 5 MG TABS tablet Take 1 tablet (5 mg total) by mouth 2 (two) times daily.  Marland Kitchen atorvastatin (LIPITOR) 20 MG tablet Take 1 tablet by mouth once daily  . B Complex Vitamins (VITAMIN B COMPLEX) TABS Take 1 tablet by mouth daily.  . Calcium Carb-Ergocalciferol 500-200 MG-UNIT TABS Take 1 tablet by mouth daily.  . carvedilol (COREG) 25 MG tablet Take 1 tablet by mouth 2 (two) times daily.  Marland Kitchen diltiazem (CARDIZEM CD) 240 MG 24 hr capsule Take 1 capsule by mouth 2 (two) times daily.  . fluticasone (FLONASE) 50 MCG/ACT nasal spray Place 1 spray into both nostrils  daily.  . furosemide (LASIX) 40 MG tablet Take 120 mg by mouth daily.  . furosemide (LASIX) 80 MG tablet Take 1 tablet by mouth daily.  . magnesium oxide (MAG-OX) 400 MG tablet Take 1 tablet by mouth daily.  . methimazole (TAPAZOLE) 10 MG tablet Take 1 tablet by mouth daily.  . pantoprazole (PROTONIX) 20 MG tablet Take 20 mg by mouth 2 (two) times daily.  . Potassium Chloride  ER 20 MEQ TBCR Take 6 tablets by mouth daily.      Allergies:   Prednisone and Digoxin   Social History   Socioeconomic History  . Marital status: Married    Spouse name: Not on file  . Number of children: Not on file  . Years of education: Not on file  . Highest education level: Not on file  Occupational History  . Not on file  Tobacco Use  . Smoking status: Former Games developer  . Smokeless tobacco: Never Used  Vaping Use  . Vaping Use: Never used  Substance and Sexual Activity  . Alcohol use: Yes  . Drug use: Never  . Sexual activity: Not Currently  Other Topics Concern  . Not on file  Social History Narrative  . Not on file   Social Determinants of Health   Financial Resource Strain: Not on file  Food Insecurity: Not on file  Transportation Needs: Not on file  Physical Activity: Not on file  Stress: Not on file  Social Connections: Not on file     Family History: The patient's family history includes Charcot-Marie-Tooth disease in her brother, daughter, and mother; Heart disease in her father; Thyroid disease in her mother. ROS:   Please see the history of present illness.    All 14 point review of systems negative except as described per history of present illness  EKGs/Labs/Other Studies Reviewed:      Recent Labs: 01/28/2020: BUN 14; Creatinine, Ser 0.89; NT-Pro BNP 207; Potassium 4.2; Sodium 142  Recent Lipid Panel    Component Value Date/Time   CHOL 185 01/17/2018 0923   TRIG 74 01/17/2018 0923   HDL 83 01/17/2018 0923   CHOLHDL 2.2 01/17/2018 0923   LDLCALC 87 01/17/2018 0923    Physical Exam:    VS:  BP 132/74 (BP Location: Right Arm, Patient Position: Sitting, Cuff Size: Normal)   Pulse (!) 56   Ht 5\' 4"  (1.626 m)   Wt 171 lb (77.6 kg)   SpO2 96%   BMI 29.35 kg/m     Wt Readings from Last 3 Encounters:  08/04/20 171 lb (77.6 kg)  04/29/20 168 lb (76.2 kg)  01/28/20 163 lb 12.8 oz (74.3 kg)     GEN:  Well nourished, well developed in no  acute distress HEENT: Normal NECK: No JVD; No carotid bruits LYMPHATICS: No lymphadenopathy CARDIAC: RRR, no murmurs, no rubs, no gallops RESPIRATORY:  Clear to auscultation without rales, wheezing or rhonchi  ABDOMEN: Soft, non-tender, non-distended MUSCULOSKELETAL:  No edema; No deformity  SKIN: Warm and dry LOWER EXTREMITIES: no swelling NEUROLOGIC:  Alert and oriented x 3 PSYCHIATRIC:  Normal affect   ASSESSMENT:    1. Chronic systolic heart failure (HCC)   2. Essential hypertension   3. Chronic obstructive pulmonary disease, unspecified COPD type (HCC)   4. Dyslipidemia   5. Paroxysmal atrial fibrillation (HCC)    PLAN:    In order of problems listed above:  1. Chronic systolic diastolic congestive failure.  I will ask her to have proBNP as well  as Chem-7 done today I am thinking about switching him from furosemide to torsemide for better bioavailability.  But again Chem-7 need to be checked first.  Still mildly decompensated. 2. Essential hypertension, blood pressure seems to be well controlled continue present management. 3. Paroxysmal atrial fibrillation seems to maintain sinus rhythm.  She is anticoagulated which I will continue. 4. Dyslipidemia, I did review her K PN however latest cholesterol have from her is 2019 showing LDL of 87 HDL 83 she is on 20 mg of Lipitor which I will continue however I will make arrangements for her fasting lipid profile to be checked.   Medication Adjustments/Labs and Tests Ordered: Current medicines are reviewed at length with the patient today.  Concerns regarding medicines are outlined above.  No orders of the defined types were placed in this encounter.  Medication changes: No orders of the defined types were placed in this encounter.   Signed, Georgeanna Lea, MD, [facility] 08/04/2020 1:42 PM    [facility]

## 2020-08-04 NOTE — Patient Instructions (Signed)
Medication Instructions:  Your physician recommends that you continue on your current medications as directed. Please refer to the Current Medication list given to you today.  *If you need a refill on your cardiac medications before your next appointment, please call your pharmacy*   Lab Work: Your physician recommends that you return for lab work today: BMP PRO BNP  If you have labs (blood work) drawn today and your tests are completely normal, you will receive your results only by: Marland Kitchen MyChart Message (if you have MyChart) OR . A paper copy in the mail If you have any lab test that is abnormal or we need to change your treatment, we will call you to review the results.   Testing/Procedures: none   Follow-Up: At [facility], you and your health needs are our priority.  As part of our continuing mission to provide you with exceptional heart care, we have created designated Provider Care Teams.  These Care Teams include your primary Cardiologist (physician) and Advanced Practice Providers (APPs -  Physician Assistants and Nurse Practitioners) who all work together to provide you with the care you need, when you need it.  We recommend signing up for the patient portal called "MyChart".  Sign up information is provided on this After Visit Summary.  MyChart is used to connect with patients for Virtual Visits (Telemedicine).  Patients are able to view lab/test results, encounter notes, upcoming appointments, etc.  Non-urgent messages can be sent to your provider as well.   To learn more about what you can do with MyChart, go to [email or web address].    Your next appointment:   5 month(s)  The format for your next appointment:   In Person  Provider:   Gypsy Balsam, MD   Other Instructions

## 2020-08-05 LAB — BASIC METABOLIC PANEL
BUN/Creatinine Ratio: 17 (ref 12–28)
BUN: 15 mg/dL (ref 8–27)
CO2: 27 mmol/L (ref 20–29)
Calcium: 9.7 mg/dL (ref 8.7–10.3)
Chloride: 99 mmol/L (ref 96–106)
Creatinine, Ser: 0.87 mg/dL (ref 0.57–1.00)
Glucose: 100 mg/dL — ABNORMAL HIGH (ref 65–99)
Potassium: 3.8 mmol/L (ref 3.5–5.2)
Sodium: 142 mmol/L (ref 134–144)
eGFR: 70 mL/min/{1.73_m2} (ref >59)

## 2020-08-05 LAB — LIPID PANEL
Chol/HDL Ratio: 2.5 ratio (ref 0.0–4.4)
Cholesterol, Total: 192 mg/dL (ref 100–199)
HDL: 78 mg/dL (ref >39)
LDL Chol Calc (NIH): 95 mg/dL (ref 0–99)
Triglycerides: 111 mg/dL (ref 0–149)
VLDL Cholesterol Cal: 19 mg/dL (ref 5–40)

## 2020-08-05 LAB — PRO B NATRIURETIC PEPTIDE: NT-Pro BNP: 165 pg/mL (ref 0–301)

## 2020-08-09 ENCOUNTER — Telehealth: Payer: Self-pay | Admitting: Cardiology

## 2020-08-09 NOTE — Telephone Encounter (Signed)
Patient informed of results.  

## 2020-08-09 NOTE — Telephone Encounter (Signed)
   Pt is returning call top get lab results

## 2021-02-28 ENCOUNTER — Encounter: Payer: Self-pay | Admitting: Cardiology

## 2021-02-28 ENCOUNTER — Other Ambulatory Visit: Payer: Self-pay

## 2021-02-28 ENCOUNTER — Ambulatory Visit: Payer: Medicare Other | Admitting: Cardiology

## 2021-02-28 VITALS — BP 136/66 | HR 62 | Ht 64.0 in | Wt 167.2 lb

## 2021-02-28 DIAGNOSIS — I5022 Chronic systolic (congestive) heart failure: Secondary | ICD-10-CM

## 2021-02-28 DIAGNOSIS — I480 Paroxysmal atrial fibrillation: Secondary | ICD-10-CM

## 2021-02-28 DIAGNOSIS — I350 Nonrheumatic aortic (valve) stenosis: Secondary | ICD-10-CM

## 2021-02-28 DIAGNOSIS — I10 Essential (primary) hypertension: Secondary | ICD-10-CM

## 2021-02-28 NOTE — Patient Instructions (Signed)
Medication Instructions:  Your physician recommends that you continue on your current medications as directed. Please refer to the Current Medication list given to you today.  *If you need a refill on your cardiac medications before your next appointment, please call your pharmacy*   Lab Work: Your physician recommends that you return for lab work today: pro bnp, bmp   If you have labs (blood work) drawn today and your tests are completely normal, you will receive your results only by: . MyChart Message (if you have MyChart) OR . A paper copy in the mail If you have any lab test that is abnormal or we need to change your treatment, we will call you to review the results.   Testing/Procedures: None   Follow-Up: At [facility], you and your health needs are our priority.  As part of our continuing mission to provide you with exceptional heart care, we have created designated Provider Care Teams.  These Care Teams include your primary Cardiologist (physician) and Advanced Practice Providers (APPs -  Physician Assistants and Nurse Practitioners) who all work together to provide you with the care you need, when you need it.  We recommend signing up for the patient portal called "MyChart".  Sign up information is provided on this After Visit Summary.  MyChart is used to connect with patients for Virtual Visits (Telemedicine).  Patients are able to view lab/test results, encounter notes, upcoming appointments, etc.  Non-urgent messages can be sent to your provider as well.   To learn more about what you can do with MyChart, go to [email or web address].    Your next appointment:   3 month(s)  The format for your next appointment:   In Person  Provider:   Robert Krasowski, MD   Other Instructions     

## 2021-02-28 NOTE — Progress Notes (Signed)
Cardiology Office Note:    Date:  02/28/2021   ID:  Kathryn Moody, DOB 11-Jun-1945, MRN [id number]  PCP:  Hortencia Conradi, NP  Cardiologist:  Gypsy Balsam, MD    Referring MD: Hortencia Conradi, NP   Chief Complaint  Patient presents with   Follow-up  Doing fine  History of Present Illness:    Kathryn Moody is a 75 y.o. female  past medical history significant for paroxysmal atrial fibrillation, she is anticoagulated, history of CVA, essential hypertension, dyslipidemia, mild aortic stenosis with last aortic valve gradient assessed in February of this year being 19.5. She complain of being short of breath.  She said anytime she tries to do something at home she will get short of breath she did not have it before that is a new findings.  She always gets a little swelling of lower extremities, denies having any chest pain tightness squeezing pressure burning chest.  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 2.7 cm nodule on right lobe and 2.0 cm nodule on left lobe.   Other vitamin B12 deficiency anemias 07/30/2020   Pain in limb 07/30/2020   Paroxysmal atrial fibrillation (HCC) 12/03/2014   Chads fast score equals 5, anticoagulated with Eliquis Chads fast score equals 5, anticoagulated with Eliquis   Primary generalized (osteo)arthritis 07/30/2020   Shoulder joint pain 07/30/2020   Type 2 diabetes mellitus without complications (HCC) 07/30/2020   Urinary tract infectious disease 07/30/2020   Vitamin D deficiency 07/30/2020    Past Surgical History:  Procedure Laterality Date   MOUTH SURGERY      Current Medications: Current Meds  Medication Sig   albuterol (VENTOLIN HFA) 108 (90 Base) MCG/ACT inhaler Inhale 1 puff into the lungs as needed for wheezing or shortness of breath.   apixaban (ELIQUIS) 5 MG TABS tablet Take 1 tablet (5 mg total) by mouth 2 (two) times daily.   atorvastatin (LIPITOR) 20 MG tablet Take 1 tablet by mouth once daily (Patient taking differently: Take 20 mg by mouth daily.)   B Complex Vitamins (VITAMIN B COMPLEX) TABS Take 1 tablet by mouth daily. Unknown strength   Calcium Carb-Ergocalciferol 500-200 MG-UNIT TABS Take 1 tablet by mouth daily.   carvedilol (COREG) 25 MG tablet Take 1 tablet by mouth 2 (two) times daily.   diltiazem (CARDIZEM CD) 240 MG 24 hr capsule Take 1 capsule by mouth 2 (two) times daily.   fluticasone (FLONASE) 50 MCG/ACT nasal spray Place 1 spray  into both nostrils daily.   magnesium oxide (MAG-OX) 400 MG tablet Take 1 tablet by mouth daily.   methimazole (TAPAZOLE) 10 MG tablet Take 1 tablet by mouth daily.   pantoprazole (PROTONIX) 40 MG tablet Take 40 mg by mouth 2 (two) times daily.   Potassium Chloride ER 20 MEQ TBCR Take 6 tablets by mouth daily.    torsemide (DEMADEX) 20 MG tablet Take 40 mg by mouth daily.   [DISCONTINUED]  pantoprazole (PROTONIX) 20 MG tablet Take 20 mg by mouth 2 (two) times daily.     Allergies:   Prednisone and Digoxin   Social History   Socioeconomic History   Marital status: Married    Spouse name: Not on file   Number of children: Not on file   Years of education: Not on file   Highest education level: Not on file  Occupational History   Not on file  Tobacco Use   Smoking status: Former   Smokeless tobacco: Never  Vaping Use   Vaping Use: Never used  Substance and Sexual Activity   Alcohol use: Yes   Drug use: Never   Sexual activity: Not Currently  Other Topics Concern   Not on file  Social History Narrative   Not on file   Social Determinants of Health   Financial Resource Strain: Not on file  Food Insecurity: Not on file  Transportation Needs: Not on file  Physical Activity: Not on file  Stress: Not on file  Social Connections: Not on file     Family History: The patient's family history includes Charcot-Marie-Tooth disease in her brother, daughter, and mother; Heart disease in her father; Thyroid disease in her mother. ROS:   Please see the history of present illness.    All 14 point review of systems negative except as described per history of present illness  EKGs/Labs/Other Studies Reviewed:      Recent Labs: 08/04/2020: BUN 15; Creatinine, Ser 0.87; NT-Pro BNP 165; Potassium 3.8; Sodium 142  Recent Lipid Panel    Component Value Date/Time   CHOL 192 08/04/2020 1432   TRIG 111 08/04/2020 1432   HDL 78 08/04/2020 1432   CHOLHDL 2.5 08/04/2020 1432   LDLCALC 95 08/04/2020 1432    Physical Exam:    VS:  BP 136/66 (BP Location: Right Arm, Patient Position: Sitting)   Pulse 62   Ht 5\' 4"  (1.626 m)   Wt 167 lb 3.2 oz (75.8 kg)   SpO2 91%   BMI 28.70 kg/m     Wt Readings from Last 3 Encounters:  02/28/21 167 lb 3.2 oz (75.8 kg)  08/04/20 171 lb (77.6 kg)  04/29/20 168 lb (76.2 kg)     GEN:  Well nourished, well developed in no acute  distress HEENT: Normal NECK: No JVD; No carotid bruits LYMPHATICS: No lymphadenopathy CARDIAC: RRR, systolic ejection murmur grade 2/6 best heard right upper portion of the sternum no rubs, no gallops RESPIRATORY:  Clear to auscultation without rales, wheezing or rhonchi  ABDOMEN: Soft, non-tender, non-distended MUSCULOSKELETAL:  No edema; No deformity  SKIN: Warm and dry LOWER EXTREMITIES: no swelling NEUROLOGIC:  Alert and oriented x 3 PSYCHIATRIC:  Normal affect   ASSESSMENT:    No diagnosis found. PLAN:    In order of problems listed above:  Chronic systolic congestive heart for but last echocardiogram showed preserved left ventricle ejection fraction.  Of course shortness of breath is concerned that she is complaining about we will check proBNP as well as Chem-7.  I did review her echocardiogram which showed preserved flow left ventricle ejection fraction with only mild aortic stenosis.  If proBNP Chem-7 will be unrevealing then we consider redoing echocardiogram to reassess left ventricle ejection fraction Essential hypertension blood pressure well controlled continue present management. Paroxysmal atrial fibrillation, she is maintaining sinus rhythm EKG done today showed beautiful sinus rhythm.  She is anticoagulated with Eliquis which I will continue. Dyslipidemia I did review K PN which show me her LDL of 95 HDL 78 this is from May of this year we will recheck her fasting lipid profile   Medication Adjustments/Labs and Tests Ordered: Current medicines are reviewed at length with the patient today.  Concerns regarding medicines are outlined above.  No orders of the defined types were placed in this encounter.  Medication changes: No orders of the defined types were placed in this encounter.   Signed, Georgeanna Lea, MD, [facility] 02/28/2021 2:50 PM    [facility]

## 2021-03-01 LAB — BASIC METABOLIC PANEL
BUN/Creatinine Ratio: 15 (ref 12–28)
BUN: 16 mg/dL (ref 8–27)
CO2: 29 mmol/L (ref 20–29)
Calcium: 10 mg/dL (ref 8.7–10.3)
Chloride: 99 mmol/L (ref 96–106)
Creatinine, Ser: 1.1 mg/dL — ABNORMAL HIGH (ref 0.57–1.00)
Glucose: 104 mg/dL — ABNORMAL HIGH (ref 70–99)
Potassium: 3.9 mmol/L (ref 3.5–5.2)
Sodium: 144 mmol/L (ref 134–144)
eGFR: 53 mL/min/{1.73_m2} — ABNORMAL LOW (ref >59)

## 2021-03-01 LAB — PRO B NATRIURETIC PEPTIDE: NT-Pro BNP: 644 pg/mL — ABNORMAL HIGH (ref 0–301)

## 2021-03-03 ENCOUNTER — Telehealth: Payer: Self-pay

## 2021-03-03 DIAGNOSIS — I480 Paroxysmal atrial fibrillation: Secondary | ICD-10-CM

## 2021-03-03 DIAGNOSIS — I10 Essential (primary) hypertension: Secondary | ICD-10-CM

## 2021-03-03 DIAGNOSIS — E785 Hyperlipidemia, unspecified: Secondary | ICD-10-CM

## 2021-03-03 DIAGNOSIS — I5022 Chronic systolic (congestive) heart failure: Secondary | ICD-10-CM

## 2021-03-03 MED ORDER — TORSEMIDE 20 MG PO TABS: ORAL_TABLET | ORAL | 1 refills | Status: DC

## 2021-03-03 NOTE — Telephone Encounter (Signed)
-----   Message from Georgeanna Lea, MD sent at 03/02/2021  5:10 PM EST ----- Increase torsemide to 40 mg morning and 20 mg evening time, Chem-7 next week

## 2021-03-03 NOTE — Telephone Encounter (Signed)
Spoke with patient husband Jasa Dundon informed of recommendation, he will inform the patient. I also call the patient and LM to return my call per Mr. Schippers request. Lab order on file and medication sent as request per her husband.

## 2021-03-03 NOTE — Telephone Encounter (Signed)
Patient notified of results and recommendations and aggred with plan

## 2021-03-10 ENCOUNTER — Other Ambulatory Visit: Payer: Self-pay

## 2021-03-10 DIAGNOSIS — I10 Essential (primary) hypertension: Secondary | ICD-10-CM

## 2021-03-10 DIAGNOSIS — E785 Hyperlipidemia, unspecified: Secondary | ICD-10-CM

## 2021-03-10 DIAGNOSIS — I5022 Chronic systolic (congestive) heart failure: Secondary | ICD-10-CM

## 2021-03-10 DIAGNOSIS — I480 Paroxysmal atrial fibrillation: Secondary | ICD-10-CM

## 2021-03-11 ENCOUNTER — Telehealth: Payer: Self-pay

## 2021-03-11 LAB — BASIC METABOLIC PANEL
BUN/Creatinine Ratio: 18 (ref 12–28)
BUN: 19 mg/dL (ref 8–27)
CO2: 27 mmol/L (ref 20–29)
Calcium: 10 mg/dL (ref 8.7–10.3)
Chloride: 96 mmol/L (ref 96–106)
Creatinine, Ser: 1.05 mg/dL — ABNORMAL HIGH (ref 0.57–1.00)
Glucose: 108 mg/dL — ABNORMAL HIGH (ref 70–99)
Potassium: 3.9 mmol/L (ref 3.5–5.2)
Sodium: 139 mmol/L (ref 134–144)
eGFR: 56 mL/min/{1.73_m2} — ABNORMAL LOW (ref >59)

## 2021-03-11 NOTE — Telephone Encounter (Signed)
-----   Message from Georgeanna Lea, MD sent at 03/11/2021 10:27 AM EST ----- Chem-7 looks good, continue present management

## 2021-03-11 NOTE — Telephone Encounter (Signed)
Spoke with Frederick(ok per DPR), results notified

## 2021-04-05 ENCOUNTER — Telehealth: Payer: Self-pay | Admitting: Cardiology

## 2021-04-05 MED ORDER — ATORVASTATIN CALCIUM 20 MG PO TABS
20.0000 mg | ORAL_TABLET | Freq: Every day | ORAL | 3 refills | Status: AC
Start: 2021-04-05 — End: ?

## 2021-04-05 NOTE — Telephone Encounter (Signed)
°*  STAT* If patient is at the pharmacy, call can be transferred to refill team.   1. Which medications need to be refilled? (please list name of each medication and dose if known) new prescription - patient is in [place]   Atorvastatin  2. Which pharmacy/location (including street and city if local pharmacy) is medication to be sent to? [facility]   [phone]  3. Do they need a 30 day or 90 day supply? 90 days

## 2021-04-05 NOTE — Telephone Encounter (Signed)
Refill sent in per request.  

## 2021-05-16 ENCOUNTER — Other Ambulatory Visit: Payer: Self-pay | Admitting: Cardiology

## 2021-06-10 ENCOUNTER — Other Ambulatory Visit: Payer: Self-pay

## 2021-06-10 ENCOUNTER — Ambulatory Visit: Payer: Medicare Other | Admitting: Cardiology

## 2021-06-10 ENCOUNTER — Encounter: Payer: Self-pay | Admitting: Cardiology

## 2021-06-10 VITALS — BP 134/62 | HR 61 | Ht 64.0 in | Wt 163.0 lb

## 2021-06-10 DIAGNOSIS — I480 Paroxysmal atrial fibrillation: Secondary | ICD-10-CM

## 2021-06-10 DIAGNOSIS — I10 Essential (primary) hypertension: Secondary | ICD-10-CM | POA: Diagnosis not present

## 2021-06-10 DIAGNOSIS — I350 Nonrheumatic aortic (valve) stenosis: Secondary | ICD-10-CM

## 2021-06-10 DIAGNOSIS — I5022 Chronic systolic (congestive) heart failure: Secondary | ICD-10-CM | POA: Diagnosis not present

## 2021-06-10 DIAGNOSIS — I6990 Unspecified sequelae of unspecified cerebrovascular disease: Secondary | ICD-10-CM

## 2021-06-10 NOTE — Progress Notes (Signed)
?Cardiology Office Note:   ? ?Date:  06/10/2021  ? ?ID:  Kathryn Moody, DOB 1945/10/11, MRN [id number] ? ?PCP:  Hortencia Conradi, NP  ?Cardiologist:  Gypsy Balsam, MD   ? ?Referring MD: Hortencia Conradi, NP  ? ?Chief Complaint  ?Patient presents with  ? Shortness of Breath  ? ? ?History of Present Illness:   ? ?Kathryn Moody is a 76 y.o. female with past medical history significant for paroxysmal atrial fibrillation, she is anticoagulated, history of CVA, essential hypertension, dyslipidemia, mild to moderate aortic stenosis she comes today to my office for follow-up.  His problem is shortness of breath.  She does have difficulty moving around after her stroke.  But still trying to participate in activities of daily living.  She does have minimal swelling of lower extremities.  Denies have any chest pain tightness squeezing pressure mid chest no palpitations. ? ?Past Medical History:  ?Diagnosis Date  ? Abscess of upper limb 07/30/2020  ? Aortic stenosis 12/05/2017  ? Atrial fibrillation (HCC) 12/03/2014  ? Chads fast score equals 5, anticoagulated with Eliquis Chads fast score equals 5, anticoagulated with Eliquis  ? Cardiac murmur, unspecified 07/30/2020  ? Chronic diastolic congestive heart failure (HCC) 03/30/2015  ? Chronic obstructive pulmonary disease (HCC) 07/30/2020  ? Chronic systolic heart failure (HCC) 03/30/2015  ? 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 Dyslipidemia 12/03/2014  ? Dysphagia following cerebral infarction 07/30/2020  ? Dysuria 07/30/2020  ? Encounter for immunization 07/30/2020  ? Essential hypertension 12/03/2014  ? Gastro-esophageal reflux disease without esophagitis 07/30/2020  ? GI bleed 10/11/2016  ? Hemorrhoids 07/30/2020  ? Hyperthyroidism 06/06/2019  ? Hypothyroidism 06/06/2019  ? Impacted cerumen 07/30/2020  ? Late effects of CVA (cerebrovascular accident) 12/03/2014  ? Left carotid bruit 03/07/2018  ? Long term (current) use of anticoagulants 07/30/2020  ? Mixed hyperlipidemia 07/30/2020   ? Multinodular goiter 06/06/2019  ? Formatting of this note might be different from the original. 2.7 cm nodule on right lobe and 2.0 cm nodule on left lobe.  ? Other vitamin B12 deficiency anemias 07/30/2020  ? Pain in limb 07/30/2020  ? Paroxysmal atrial fibrillation (HCC) 12/03/2014  ? Chads fast score equals 5, anticoagulated with Eliquis Chads fast score equals 5, anticoagulated with Eliquis  ? Primary generalized (osteo)arthritis 07/30/2020  ? Shoulder joint pain 07/30/2020  ? Type 2 diabetes mellitus without complications (HCC) 07/30/2020  ? Urinary tract infectious disease 07/30/2020  ? Vitamin D deficiency 07/30/2020  ? ? ?Past Surgical History:  ?Procedure Laterality Date  ? MOUTH SURGERY    ? ? ?Current Medications: ?Current Meds  ?Medication Sig  ? albuterol (VENTOLIN HFA) 108 (90 Base) MCG/ACT inhaler Inhale 1 puff into the lungs as needed for wheezing or shortness of breath.  ? apixaban (ELIQUIS) 5 MG TABS tablet Take 1 tablet (5 mg total) by mouth 2 (two) times daily.  ? atorvastatin (LIPITOR) 20 MG tablet Take 1 tablet (20 mg total) by mouth daily.  ? B Complex Vitamins (VITAMIN B COMPLEX) TABS Take 1 tablet by mouth daily. Unknown strength  ? Calcium Carb-Ergocalciferol 500-200 MG-UNIT TABS Take 1 tablet by mouth daily.  ? carvedilol (COREG) 25 MG tablet Take 1 tablet by mouth 2 (two) times daily.  ? diltiazem (CARDIZEM CD) 240 MG 24 hr capsule Take 1 capsule by mouth 2 (two) times daily.  ? fluticasone (FLONASE) 50 MCG/ACT nasal spray Place 1 spray into both nostrils daily.  ? magnesium oxide (MAG-OX) 400 MG tablet Take 1  tablet by mouth daily.  ? methimazole (TAPAZOLE) 10 MG tablet Take 1 tablet by mouth daily.  ? pantoprazole (PROTONIX) 40 MG tablet Take 40 mg by mouth 2 (two) times daily.  ? Potassium Chloride ER 20 MEQ TBCR Take 6 tablets by mouth daily.   ? torsemide (DEMADEX) 20 MG tablet TAKE 2 TABLETS BY MOUTH IN THE MORNING AND 1 IN THE EVENING (Patient taking differently: Take 40 mg by mouth 2 (two)  times daily. TAKE 2 TABLETS BY MOUTH IN THE MORNING AND 1 IN THE EVENING)  ?  ? ?Allergies:   Prednisone and Digoxin  ? ?Social History  ? ?Socioeconomic History  ? Marital status: Married  ?  Spouse name: Not on file  ? Number of children: Not on file  ? Years of education: Not on file  ? Highest education level: Not on file  ?Occupational History  ? Not on file  ?Tobacco Use  ? Smoking status: Former  ? Smokeless tobacco: Never  ?Vaping Use  ? Vaping Use: Never used  ?Substance and Sexual Activity  ? Alcohol use: Yes  ? Drug use: Never  ? Sexual activity: Not Currently  ?Other Topics Concern  ? Not on file  ?Social History Narrative  ? Not on file  ? ?Social Determinants of Health  ? ?Financial Resource Strain: Not on file  ?Food Insecurity: Not on file  ?Transportation Needs: Not on file  ?Physical Activity: Not on file  ?Stress: Not on file  ?Social Connections: Not on file  ?  ? ?Family History: ?The patient's family history includes Charcot-Marie-Tooth disease in her brother, daughter, and mother; Heart disease in her father; Thyroid disease in her mother. ?ROS:   ?Please see the history of present illness.    ?All 14 point review of systems negative except as described per history of present illness ? ?EKGs/Labs/Other Studies Reviewed:   ? ? ? ?Recent Labs: ?02/28/2021: NT-Pro BNP 644 ?03/10/2021: BUN 19; Creatinine, Ser 1.05; Potassium 3.9; Sodium 139  ?Recent Lipid Panel ?   ?Component Value Date/Time  ? CHOL 192 08/04/2020 1432  ? TRIG 111 08/04/2020 1432  ? HDL 78 08/04/2020 1432  ? CHOLHDL 2.5 08/04/2020 1432  ? LDLCALC 95 08/04/2020 1432  ? ? ?Physical Exam:   ? ?VS:  BP 134/62 (BP Location: Left Arm, Patient Position: Sitting)   Pulse 61   Ht 5\' 4"  (1.626 m)   Wt 163 lb (73.9 kg)   SpO2 92%   BMI 27.98 kg/m?    ? ?Wt Readings from Last 3 Encounters:  ?06/10/21 163 lb (73.9 kg)  ?02/28/21 167 lb 3.2 oz (75.8 kg)  ?08/04/20 171 lb (77.6 kg)  ?  ? ?GEN:  Well nourished, well developed in no acute  distress ?HEENT: Normal ?NECK: No JVD; No carotid bruits ?LYMPHATICS: No lymphadenopathy ?CARDIAC: RRR, systolic ejection murmur grade 2/6 best heard right upper portion of the sternum, no rubs, no gallops ?RESPIRATORY:  Clear to auscultation without rales, wheezing or rhonchi  ?ABDOMEN: Soft, non-tender, non-distended ?MUSCULOSKELETAL:  No edema; No deformity  ?SKIN: Warm and dry ?LOWER EXTREMITIES: no swelling ?NEUROLOGIC:  Alert and oriented x 3 ?PSYCHIATRIC:  Normal affect  ? ?ASSESSMENT:   ? ?1. Paroxysmal atrial fibrillation (HCC)   ?2. Nonrheumatic aortic valve stenosis   ?3. Essential hypertension   ?4. Chronic systolic heart failure (HCC)   ?5. Late effects of CVA (cerebrovascular accident)   ? ?PLAN:   ? ?In order of problems listed above: ? ?Paroxysmal  atrial fibrillation.  Maintain anticoagulation.  Doing well from that point review ?Nonrheumatic aortic valve stenosis which is only moderate.  Continue monitoring. ?Essential hypertension blood pressure seems to well controlled continue present management. ?Chronic diastolic congestive heart failure.  Echocardiogram reviewed showed preserved left ventricle ejection fraction, left atrium was mildly dilated mild to moderate aortic valve stenosis none of this was critical. ?Late effect of CVA still residual changes noted. ?Dyspnea on exertion I think it is multifactorial some deconditioning playing while here.  I will recheck proBNP also cautioned her about drinking too much water.  Chem-7 will be checked as well. ? ? ?Medication Adjustments/Labs and Tests Ordered: ?Current medicines are reviewed at length with the patient today.  Concerns regarding medicines are outlined above.  ?No orders of the defined types were placed in this encounter. ? ?Medication changes: No orders of the defined types were placed in this encounter. ? ? ?Signed, ?Georgeanna Lea, MD, [facility] ?06/10/2021 3:04 PM    ?[facility] ?

## 2021-06-10 NOTE — Patient Instructions (Signed)
Medication Instructions:  °Your physician recommends that you continue on your current medications as directed. Please refer to the Current Medication list given to you today. ° °*If you need a refill on your cardiac medications before your next appointment, please call your pharmacy* ° ° °Lab Work: °Your physician recommends that you return for lab work in:  ° °Labs today: BMP, Pro BNP ° °If you have labs (blood work) drawn today and your tests are completely normal, you will receive your results only by: °MyChart Message (if you have MyChart) OR °A paper copy in the mail °If you have any lab test that is abnormal or we need to change your treatment, we will call you to review the results. ° ° °Testing/Procedures: °None ° ° °Follow-Up: °At [facility], you and your health needs are our priority.  As part of our continuing mission to provide you with exceptional heart care, we have created designated Provider Care Teams.  These Care Teams include your primary Cardiologist (physician) and Advanced Practice Providers (APPs -  Physician Assistants and Nurse Practitioners) who all work together to provide you with the care you need, when you need it. ° °We recommend signing up for the patient portal called "MyChart".  Sign up information is provided on this After Visit Summary.  MyChart is used to connect with patients for Virtual Visits (Telemedicine).  Patients are able to view lab/test results, encounter notes, upcoming appointments, etc.  Non-urgent messages can be sent to your provider as well.   °To learn more about what you can do with MyChart, go to [email or web address].   ° °Your next appointment:   °4 month(s) ° °The format for your next appointment:   °In Person ° °Provider:   °Robert Krasowski, MD  ° ° °Other Instructions °None ° °

## 2021-06-10 NOTE — Addendum Note (Signed)
Addended by: Roxanne Mins I on: 06/10/2021 03:14 PM ? ? Modules accepted: Orders ? ?

## 2021-06-11 LAB — BASIC METABOLIC PANEL
BUN/Creatinine Ratio: 20 (ref 12–28)
BUN: 23 mg/dL (ref 8–27)
CO2: 26 mmol/L (ref 20–29)
Calcium: 9.8 mg/dL (ref 8.7–10.3)
Chloride: 98 mmol/L (ref 96–106)
Creatinine, Ser: 1.16 mg/dL — ABNORMAL HIGH (ref 0.57–1.00)
Glucose: 95 mg/dL (ref 70–99)
Potassium: 4.5 mmol/L (ref 3.5–5.2)
Sodium: 142 mmol/L (ref 134–144)
eGFR: 49 mL/min/{1.73_m2} — ABNORMAL LOW (ref >59)

## 2021-06-11 LAB — PRO B NATRIURETIC PEPTIDE: NT-Pro BNP: 272 pg/mL (ref 0–301)

## 2021-06-18 ENCOUNTER — Other Ambulatory Visit: Payer: Self-pay | Admitting: Cardiology

## 2021-11-17 DIAGNOSIS — M216X2 Other acquired deformities of left foot: Secondary | ICD-10-CM | POA: Insufficient documentation

## 2021-11-17 DIAGNOSIS — L84 Corns and callosities: Secondary | ICD-10-CM | POA: Insufficient documentation

## 2021-11-25 ENCOUNTER — Encounter: Payer: Self-pay | Admitting: Cardiology

## 2021-11-25 ENCOUNTER — Ambulatory Visit: Payer: Medicare Other | Attending: Cardiology | Admitting: Cardiology

## 2021-11-25 VITALS — BP 126/68 | HR 49 | Ht 64.0 in | Wt 166.4 lb

## 2021-11-25 DIAGNOSIS — I350 Nonrheumatic aortic (valve) stenosis: Secondary | ICD-10-CM | POA: Diagnosis not present

## 2021-11-25 DIAGNOSIS — I480 Paroxysmal atrial fibrillation: Secondary | ICD-10-CM | POA: Diagnosis not present

## 2021-11-25 DIAGNOSIS — I5022 Chronic systolic (congestive) heart failure: Secondary | ICD-10-CM

## 2021-11-25 DIAGNOSIS — I10 Essential (primary) hypertension: Secondary | ICD-10-CM | POA: Diagnosis not present

## 2021-11-25 DIAGNOSIS — I5032 Chronic diastolic (congestive) heart failure: Secondary | ICD-10-CM
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
## 2021-11-25 NOTE — Patient Instructions (Signed)

## 2021-11-30 ENCOUNTER — Other Ambulatory Visit: Payer: Self-pay

## 2022-04-20 ENCOUNTER — Telehealth: Payer: Self-pay | Admitting: Cardiology

## 2022-04-20 MED ORDER — DILTIAZEM HCL ER COATED BEADS 240 MG PO CP24: 240.0000 mg | ORAL_CAPSULE | Freq: Two times a day (BID) | ORAL | 2 refills | Status: DC

## 2022-04-20 NOTE — Telephone Encounter (Signed)
*  STAT* If patient is at the pharmacy, call can be transferred to refill team.   1. Which medications need to be refilled? (please list name of each medication and dose if known)  diltiazem (CARDIZEM CD) 240 MG 24 hr capsule twice daily  2. Which pharmacy/location (including street and city if local pharmacy) is medication to be sent to? [place], [place]  3. Do they need a 30 day or 90 day supply?  90 day supply

## 2022-04-20 NOTE — Telephone Encounter (Signed)
RX sent

## 2022-05-29 ENCOUNTER — Ambulatory Visit: Payer: Medicare Other | Attending: Cardiology | Admitting: Cardiology

## 2022-05-29 ENCOUNTER — Encounter: Payer: Self-pay | Admitting: Cardiology

## 2022-05-29 VITALS — BP 150/84 | HR 55 | Ht 64.0 in | Wt 158.0 lb

## 2022-05-29 DIAGNOSIS — I5022 Chronic systolic (congestive) heart failure: Secondary | ICD-10-CM | POA: Diagnosis not present

## 2022-05-29 DIAGNOSIS — I480 Paroxysmal atrial fibrillation: Secondary | ICD-10-CM | POA: Diagnosis not present

## 2022-05-29 DIAGNOSIS — E785 Hyperlipidemia, unspecified: Secondary | ICD-10-CM

## 2022-05-29 DIAGNOSIS — I6990 Unspecified sequelae of unspecified cerebrovascular disease: Secondary | ICD-10-CM | POA: Diagnosis not present

## 2022-05-29 DIAGNOSIS — I10 Essential (primary) hypertension: Secondary | ICD-10-CM | POA: Diagnosis not present

## 2022-05-29 DIAGNOSIS — I350 Nonrheumatic aortic (valve) stenosis: Secondary | ICD-10-CM

## 2022-05-29 NOTE — Progress Notes (Unsigned)
Cardiology Office Note:    Date:  05/29/2022   ID:  Kathryn Moody, DOB 12-31-1945, MRN [id number]  PCP:  Zoila Shutter, NP  Cardiologist:  Jenne Campus, MD    Referring MD: Zoila Shutter, NP   Chief Complaint  Patient presents with   Follow-up    History of Present Illness:    Kathryn Moody is a 77 y.o. female  with past medical history significant for paroxysmal atrial fibrillation, she is anticoagulated, history of CVA, essential hypertension, dyslipidemia, mild to moderate aortic stenosis she comes today to my office for follow-up.  Comes today to months for follow-up.  Overall seems to be doing well.  She still grieving after her husband passing but overall holding quite okay.  Denies any chest pain tightness squeezing pressure burning chest.  Sometimes she is aware of heart beating look like she does have some extra beats but no atrial fibrillation.  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   Dyslipidemia 12/03/2014   Dysphagia following cerebral infarction 07/30/2020   Dysuria 07/30/2020   Encounter for immunization 07/30/2020   Essential hypertension 12/03/2014   Gastro-esophageal reflux disease without esophagitis 07/30/2020   GI bleed 10/11/2016   Hemorrhoids 07/30/2020   Hyperthyroidism 06/06/2019   Hypothyroidism 06/06/2019   Impacted cerumen 07/30/2020   Late effects of CVA (cerebrovascular accident) 12/03/2014   Left carotid bruit 03/07/2018   Long term (current) use of anticoagulants 07/30/2020   Mixed  hyperlipidemia 07/30/2020   Multinodular goiter 06/06/2019   Formatting of this note might be different from the original. 2.7 cm nodule on right lobe and 2.0 cm nodule on left lobe.   Other vitamin B12 deficiency anemias 07/30/2020   Pain in limb 07/30/2020   Paroxysmal atrial fibrillation ([place]) 12/03/2014   Chads fast score equals 5, anticoagulated with Eliquis Chads fast score equals 5, anticoagulated with Eliquis   Primary generalized (osteo)arthritis 07/30/2020   Shoulder joint pain 07/30/2020   Type 2 diabetes mellitus without complications ([place]) 123XX123   Urinary tract infectious disease 07/30/2020   Vitamin D deficiency 07/30/2020    Past Surgical History:  Procedure Laterality Date   MOUTH SURGERY      Current Medications: Current Meds  Medication Sig   albuterol (VENTOLIN HFA) 108 (90 Base) MCG/ACT inhaler Inhale 1 puff into the lungs as needed for wheezing or shortness of breath.   apixaban (ELIQUIS) 5 MG TABS tablet Take 1 tablet (5 mg total) by mouth 2 (two) times daily.   Ascorbic Acid (VITAMIN C ER PO) Take 1 tablet by mouth daily.   atorvastatin (LIPITOR) 20 MG tablet Take 1 tablet (20 mg total) by mouth daily.   B Complex Vitamins (VITAMIN B COMPLEX) TABS Take 1 tablet by mouth daily. Unknown strength   budesonide-formoterol (SYMBICORT) 160-4.5 MCG/ACT inhaler Inhale 2 puffs into the lungs as needed (sob, wheezing).   Calcium Carb-Ergocalciferol 500-200 MG-UNIT TABS Take 1 tablet by mouth daily.   carvedilol (COREG) 25 MG tablet Take 1 tablet by mouth 2 (two) times daily.   Cyanocobalamin (VITAMIN B-12 PO) Take  1 tablet by mouth daily.   diltiazem (CARDIZEM CD) 240 MG 24 hr capsule Take 1 capsule (240 mg total) by mouth 2 (two) times daily.   Ferrous Gluconate (IRON 27 PO) Take 1 tablet by mouth daily.   fluticasone (FLONASE) 50 MCG/ACT nasal spray Place 1 spray into both nostrils daily.   folic acid (FOLVITE) 1 MG tablet Take 1 mg by mouth daily.   magnesium oxide (MAG-OX) 400 MG  tablet Take 1 tablet by mouth daily.   methimazole (TAPAZOLE) 10 MG tablet Take 1 tablet by mouth daily.   Multiple Vitamin (MULTIVITAMIN ADULT PO) Take 1 tablet by mouth daily.   NASAL SALINE NA Place 1 spray into the nose daily.   pantoprazole (PROTONIX) 40 MG tablet Take 40 mg by mouth 2 (two) times daily.   Potassium Chloride ER 20 MEQ TBCR Take 6 tablets by mouth daily.    Pyridoxine HCl (VITAMIN B-6 PO) Take 1 tablet by mouth daily.   torsemide (DEMADEX) 20 MG tablet TAKE 2 TABLETS BY MOUTH IN THE MORNING AND 1 IN THE EVENING (Patient taking differently: Take 40 mg by mouth See admin instructions. TAKE 2 TABLETS BY MOUTH IN THE MORNING AND 1 IN THE EVENING)   VITAMIN A PO Take 1 tablet by mouth daily.   VITAMIN E PO Take 1 tablet by mouth daily.   vitamin k 100 MCG tablet Take 100 mcg by mouth daily.   zinc gluconate 50 MG tablet Take 50 mg by mouth daily.     Allergies:   Prednisone and Digoxin   Social History   Socioeconomic History   Marital status: Widowed    Spouse name: Not on file   Number of children: Not on file   Years of education: Not on file   Highest education level: Not on file  Occupational History   Not on file  Tobacco Use   Smoking status: Former   Smokeless tobacco: Never  Vaping Use   Vaping Use: Never used  Substance and Sexual Activity   Alcohol use: Yes   Drug use: Never   Sexual activity: Not Currently  Other Topics Concern   Not on file  Social History Narrative   Not on file   Social Determinants of Health   Financial Resource Strain: Not on file  Food Insecurity: Not on file  Transportation Needs: Not on file  Physical Activity: Not on file  Stress: Not on file  Social Connections: Not on file     Family History: The patient's family history includes Charcot-Marie-Tooth disease in her brother, daughter, and mother; Heart disease in her father; Thyroid disease in her mother. ROS:   Please see the history of present illness.     All 14 point review of systems negative except as described per history of present illness  EKGs/Labs/Other Studies Reviewed:      Recent Labs: 06/10/2021: BUN 23; Creatinine, Ser 1.16; NT-Pro BNP 272; Potassium 4.5; Sodium 142  Recent Lipid Panel    Component Value Date/Time   CHOL 192 08/04/2020 1432   TRIG 111 08/04/2020 1432   HDL 78 08/04/2020 1432   CHOLHDL 2.5 08/04/2020 1432   LDLCALC 95 08/04/2020 1432    Physical Exam:    VS:  BP (!) 150/84 (BP Location: Right Arm, Patient Position: Sitting, Cuff Size: Normal)   Pulse (!) 55   Ht '5\' 4"'$  (1.626 m)   Wt 158 lb (71.7 kg)   SpO2 98%   BMI 27.12 kg/m  Wt Readings from Last 3 Encounters:  05/29/22 158 lb (71.7 kg)  11/25/21 166 lb 6.4 oz (75.5 kg)  06/10/21 163 lb (73.9 kg)     GEN:  Well nourished, well developed in no acute distress HEENT: Normal NECK: No JVD; No carotid bruits LYMPHATICS: No lymphadenopathy CARDIAC: RRR, systolic ejection murmur grade 3/6 best heard right upper portion of the sternum, no rubs, no gallops RESPIRATORY:  Clear to auscultation without rales, wheezing or rhonchi  ABDOMEN: Soft, non-tender, non-distended MUSCULOSKELETAL:  No edema; No deformity  SKIN: Warm and dry LOWER EXTREMITIES: no swelling NEUROLOGIC:  Alert and oriented x 3 PSYCHIATRIC:  Normal affect   ASSESSMENT:    1. Paroxysmal atrial fibrillation (HCC)   2. Chronic systolic heart failure ([place])   3. Essential hypertension   4. Late effects of CVA (cerebrovascular accident)   5. Dyslipidemia   6. Nonrheumatic aortic valve stenosis    PLAN:    In order of problems listed above:  Paroxysmal atrial fibrillation: Continue anticoagulation.  Described to have some pieces of palpitation but it does not sound like atrial fibrillation.  I told her if these become more frequent we will put Zio patch. Chronic systolic congestive heart failure is compensated continue present management. Essential hypertension blood  pressure still elevated.  Will do Chem-7 today if Chem-7 is fine we will add Aldactone. Late effects of CVA.  Stable. Dyslipidemia.  I did review K PN but I do have data from 2022 we will schedule her to have fasting lipid profile done. Nonrheumatic aortic valve stenosis which is noncritical.  Will schedule her to have another echocardiogram in September   Medication Adjustments/Labs and Tests Ordered: Current medicines are reviewed at length with the patient today.  Concerns regarding medicines are outlined above.  No orders of the defined types were placed in this encounter.  Medication changes: No orders of the defined types were placed in this encounter.   Signed, Park Liter, MD, [facility] 05/29/2022 4:03 PM    [place]

## 2022-05-29 NOTE — Patient Instructions (Addendum)
Medication Instructions:  Your physician recommends that you continue on your current medications as directed. Please refer to the Current Medication list given to you today.  *If you need a refill on your cardiac medications before your next appointment, please call your pharmacy*   Lab Work: BMP- today If you have labs (blood work) drawn today and your tests are completely normal, you will receive your results only by: [place] (if you have MyChart) OR A paper copy in the mail If you have any lab test that is abnormal or we need to change your treatment, we will call you to review the results.   Testing/Procedures: September 2024 Your physician has requested that you have an echocardiogram. Echocardiography is a painless test that uses sound waves to create images of your heart. It provides your doctor with information about the size and shape of your heart and how well your heart's chambers and valves are working. This procedure takes approximately one hour. There are no restrictions for this procedure. Please do NOT wear cologne, perfume, aftershave, or lotions (deodorant is allowed). Please arrive 15 minutes prior to your appointment time.    Follow-Up: At [facility], you and your health needs are our priority.  As part of our continuing mission to provide you with exceptional heart care, we have created designated Provider Care Teams.  These Care Teams include your primary Cardiologist (physician) and Advanced Practice Providers (APPs -  Physician Assistants and Nurse Practitioners) who all work together to provide you with the care you need, when you need it.  We recommend signing up for the patient portal called "MyChart".  Sign up information is provided on this After Visit Summary.  MyChart is used to connect with patients for Virtual Visits (Telemedicine).  Patients are able to view lab/test results, encounter notes, upcoming appointments, etc.  Non-urgent messages can be  sent to your provider as well.   To learn more about what you can do with MyChart, go to [email or web address].    Your next appointment:   6 month(s)  The format for your next appointment:   In Person  Provider:   Jenne Campus, MD    Other Instructions NA

## 2022-05-30 LAB — BASIC METABOLIC PANEL
BUN/Creatinine Ratio: 20 (ref 12–28)
BUN: 20 mg/dL (ref 8–27)
CO2: 26 mmol/L (ref 20–29)
Calcium: 9.9 mg/dL (ref 8.7–10.3)
Chloride: 98 mmol/L (ref 96–106)
Creatinine, Ser: 0.99 mg/dL (ref 0.57–1.00)
Glucose: 102 mg/dL — ABNORMAL HIGH (ref 70–99)
Potassium: 4.3 mmol/L (ref 3.5–5.2)
Sodium: 142 mmol/L (ref 134–144)
eGFR: 59 mL/min/{1.73_m2} — ABNORMAL LOW (ref >59)

## 2022-06-07 DIAGNOSIS — Z8673 Personal history of transient ischemic attack (TIA), and cerebral infarction without residual deficits: Secondary | ICD-10-CM | POA: Insufficient documentation

## 2022-08-15 DIAGNOSIS — C519 Malignant neoplasm of vulva, unspecified: Secondary | ICD-10-CM | POA: Insufficient documentation

## 2022-09-05 DIAGNOSIS — R911 Solitary pulmonary nodule: Secondary | ICD-10-CM | POA: Insufficient documentation

## 2022-10-12 ENCOUNTER — Telehealth: Payer: Self-pay | Admitting: Cardiology

## 2022-10-12 NOTE — Telephone Encounter (Signed)
Spoke with Kathryn Moody who states that Dr. Lambert Mody wanted to consult with Dr. Bing Matter regarding patients BP. Kathryn Moody states that the staff at the [facility] has noticed a trend that her BP is getting lower a lot of the time but heart rate is in the lower 60's. Kathryn Moody states that she just feels weak. Please advise.

## 2022-10-12 NOTE — Telephone Encounter (Signed)
Pt c/o BP issue: STAT if pt c/o blurred vision, one-sided weakness or slurred speech  1. What are your last 5 BP readings?  112/62, 108/69, 96/59  2. Are you having any other symptoms (ex. Dizziness, headache, blurred vision, passed out)?   3. What is your BP issue?   BP has been low. Chemo doctor advised patient to follow up with Dr. Bing Matter to discuss altering BP medications.

## 2022-10-24 MED ORDER — DILTIAZEM HCL ER COATED BEADS 180 MG PO CP24: 180.0000 mg | ORAL_CAPSULE | Freq: Every day | ORAL | 3 refills | Status: DC

## 2022-10-24 NOTE — Telephone Encounter (Signed)
Detailed vm left for pt with Dr. Vanetta Shawl recommendation.

## 2022-10-24 NOTE — Addendum Note (Signed)
Addended by: Eleonore Chiquito on: 10/24/2022 12:59 PM   Modules accepted: Orders

## 2022-10-24 NOTE — Telephone Encounter (Signed)
Recommendations reviewed with pt as per Dr. Krasowski's note.  Pt verbalized understanding and had no additional questions.  

## 2022-12-06 DIAGNOSIS — Z87891 Personal history of nicotine dependence: Secondary | ICD-10-CM | POA: Insufficient documentation

## 2022-12-06 DIAGNOSIS — Z7409 Other reduced mobility: Secondary | ICD-10-CM | POA: Insufficient documentation

## 2022-12-06 DIAGNOSIS — J9610 Chronic respiratory failure, unspecified whether with hypoxia or hypercapnia: Secondary | ICD-10-CM | POA: Insufficient documentation

## 2022-12-06 DIAGNOSIS — Z9981 Dependence on supplemental oxygen: Secondary | ICD-10-CM | POA: Insufficient documentation

## 2022-12-07 ENCOUNTER — Telehealth: Payer: Self-pay | Admitting: Cardiology

## 2022-12-07 NOTE — Telephone Encounter (Signed)
   Pre-operative Risk Assessment    Patient Name: Kathryn Moody  DOB: May 14, 1945 MRN: [id number]      Request for Surgical Clearance    Procedure:   Vulvectomy, vaginectomy, inguinal node disection with general anesthesia  Date of Surgery:  Clearance TBD                                 Surgeon:  Dr. Amparo Bristol Surgeon's Group or Practice Name:   [facility]  Phone number:  [phone]  Fax number:  [phone]   Type of Clearance Requested:   - Medical  - Pharmacy:  Hold        Type of Anesthesia:  General    Additional requests/questions:   Caller Noreene Larsson) noted this will be a 3 hour surgery.  Signed, Annetta Maw   12/07/2022, 9:45 AM

## 2022-12-08 NOTE — Telephone Encounter (Signed)
Patient with diagnosis of afib on Eliquis for anticoagulation.    Procedure: Vulvectomy, vaginectomy, inguinal node disection with general anesthesia   Date of procedure: TBD  CHA2DS2-VASc Score = 7  This indicates a 11.2% annual risk of stroke. The patient's score is based upon: CHF History: 1 HTN History: 1 Diabetes History: 0 (listed on PMH but A1c is normal on no meds) Stroke History: 2 Vascular Disease History: 0 Age Score: 2 Gender Score: 1   CVA 2016  CrCl 51mL/min Platelet count 252K  Per office protocol, patient can hold Eliquis for 2-3 days prior to procedure. Resume as soon as safely possible post-procedure.  **This guidance is not considered finalized until pre-operative APP has relayed final recommendations.**

## 2022-12-08 NOTE — Telephone Encounter (Signed)
Name: Kathryn Moody  DOB: Aug 05, 1945  MRN: [id number]  Primary Cardiologist: None  Chart reviewed as part of pre-operative protocol coverage. Because of Kathryn Moody's past medical history and time since last visit, she will require a follow-up in-office visit in order to better assess preoperative cardiovascular risk.  Pre-op covering staff: - Please schedule appointment and call patient to inform them. If patient already had an upcoming appointment within acceptable timeframe, please add "pre-op clearance" to the appointment notes so provider is aware. - Please contact requesting surgeon's office via preferred method (i.e, phone, fax) to inform them of need for appointment prior to surgery.  Per office protocol, patient can hold Eliquis for 2-3 days prior to procedure. Resume as soon as safely possible post-procedure.   [name], NP  12/08/2022, 12:59 PM

## 2022-12-08 NOTE — Telephone Encounter (Signed)
Pt has been scheduled to see Dr. Bing Matter 12/14/22 @ 2:20 for pre op clearance. Pt thanked me for the call and the help. I will update all parties involved.

## 2022-12-12 DIAGNOSIS — M21171 Varus deformity, not elsewhere classified, right ankle: Secondary | ICD-10-CM | POA: Insufficient documentation

## 2022-12-14 ENCOUNTER — Ambulatory Visit: Payer: Medicare Other | Attending: Cardiology | Admitting: Cardiology

## 2022-12-14 ENCOUNTER — Other Ambulatory Visit: Payer: Self-pay

## 2022-12-14 ENCOUNTER — Encounter: Payer: Self-pay | Admitting: Cardiology

## 2022-12-14 VITALS — BP 120/64 | HR 57 | Ht 64.0 in | Wt 143.8 lb

## 2022-12-14 DIAGNOSIS — I5032 Chronic diastolic (congestive) heart failure: Secondary | ICD-10-CM

## 2022-12-14 DIAGNOSIS — Z01810 Encounter for preprocedural cardiovascular examination: Secondary | ICD-10-CM

## 2022-12-14 DIAGNOSIS — Z8673 Personal history of transient ischemic attack (TIA), and cerebral infarction without residual deficits: Secondary | ICD-10-CM

## 2022-12-14 DIAGNOSIS — I480 Paroxysmal atrial fibrillation: Secondary | ICD-10-CM

## 2022-12-14 DIAGNOSIS — I350 Nonrheumatic aortic (valve) stenosis: Secondary | ICD-10-CM

## 2022-12-14 DIAGNOSIS — E785 Hyperlipidemia, unspecified: Secondary | ICD-10-CM

## 2022-12-14 MED ORDER — APIXABAN 5 MG PO TABS: 5.0000 mg | ORAL_TABLET | Freq: Two times a day (BID) | ORAL | Status: AC

## 2022-12-14 NOTE — Progress Notes (Signed)
Cardiology Office Note:    Date:  12/14/2022   ID:  [name], DOB [birth year], MRN [id number]  PCP:  Hortencia Conradi, NP  Cardiologist:  Gypsy Balsam, MD    Referring MD: Hortencia Conradi, NP   Chief Complaint  Patient presents with   Clearance  TBD    Vulvectomy, Vaginectomy and inguinal dissection  General Anesthesia  Dr. Di Kindle berry    History of Present Illness:    Kathryn Moody is a 77 y.o. female with past medical history significant for paroxysmal atrial fibrillation, she is anticoagulated, history of CVA, essential hypertension, dyslipidemia, moderate aortic stenosis.  Comes today to my office because she required surgery under general esthesia she is walking around without oxygen.  Also use walker.  Denies of any chest pain tightness squeezing pressure burning chest just fatigue tiredness and weakness.  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 2.7 cm nodule on right lobe and 2.0 cm nodule on left lobe.   Other vitamin B12 deficiency anemias 07/30/2020   Pain in limb 07/30/2020   Paroxysmal atrial fibrillation (HCC) 12/03/2014   Chads fast score equals 5, anticoagulated with Eliquis Chads fast score equals 5, anticoagulated with Eliquis   Primary generalized (osteo)arthritis 07/30/2020   Shoulder joint pain 07/30/2020   Type 2 diabetes mellitus without complications (HCC) 07/30/2020   Urinary tract infectious disease 07/30/2020   Vitamin D deficiency 07/30/2020    Past Surgical History:  Procedure Laterality Date   MOUTH SURGERY      Current Medications: Current Meds  Medication Sig   albuterol (VENTOLIN HFA) 108 (90 Base) MCG/ACT inhaler Inhale 1 puff into the lungs as needed for wheezing or shortness of breath.   apixaban (ELIQUIS) 5 MG TABS tablet Take 1 tablet (5 mg total) by mouth 2 (two) times daily.   Ascorbic Acid (VITAMIN C ER PO) Take 1 tablet by mouth daily.   atorvastatin (LIPITOR) 20 MG tablet Take 1 tablet (20 mg total) by mouth daily.   B Complex Vitamins (VITAMIN B COMPLEX) TABS Take 1 tablet by mouth daily. Unknown strength   budesonide-formoterol (SYMBICORT) 160-4.5 MCG/ACT inhaler Inhale 2 puffs into the lungs as needed (sob, wheezing).   Calcium Carb-Ergocalciferol 500-200 MG-UNIT TABS Take 1 tablet by mouth daily.   carvedilol (COREG) 25 MG tablet Take 1 tablet by mouth 2 (two) times daily.   Cyanocobalamin (VITAMIN B-12 PO) Take 1 tablet by mouth daily.   diltiazem (CARDIZEM CD) 180 MG  24 hr capsule Take 1 capsule (180 mg total) by mouth daily.   Ferrous Gluconate (IRON 27 PO) Take 1 tablet by mouth daily.   fluticasone (FLONASE) 50 MCG/ACT nasal spray Place 1 spray into both nostrils daily.   folic acid (FOLVITE) 1 MG tablet Take 1 mg by mouth daily.   magnesium oxide (MAG-OX) 400 MG tablet Take 1 tablet by mouth daily.   methimazole  (TAPAZOLE) 10 MG tablet Take 1 tablet by mouth daily.   Multiple Vitamin (MULTIVITAMIN ADULT PO) Take 1 tablet by mouth daily.   NASAL SALINE NA Place 1 spray into the nose daily.   pantoprazole (PROTONIX) 40 MG tablet Take 40 mg by mouth 2 (two) times daily.   Potassium Chloride ER 20 MEQ TBCR Take 6 tablets by mouth daily.    Pyridoxine HCl (VITAMIN B-6 PO) Take 1 tablet by mouth daily.   torsemide (DEMADEX) 20 MG tablet TAKE 2 TABLETS BY MOUTH IN THE MORNING AND 1 IN THE EVENING (Patient taking differently: Take 40 mg by mouth See admin instructions. TAKE 2 TABLETS BY MOUTH IN THE MORNING AND 1 IN THE EVENING)   VITAMIN A PO Take 1 tablet by mouth daily.   VITAMIN E PO Take 1 tablet by mouth daily.   vitamin k 100 MCG tablet Take 100 mcg by mouth daily.   zinc gluconate 50 MG tablet Take 50 mg by mouth daily.     Allergies:   Prednisone and Digoxin   Social History   Socioeconomic History   Marital status: Widowed    Spouse name: Not on file   Number of children: Not on file   Years of education: Not on file   Highest education level: Not on file  Occupational History   Not on file  Tobacco Use   Smoking status: Former   Smokeless tobacco: Never  Vaping Use   Vaping status: Never Used  Substance and Sexual Activity   Alcohol use: Yes   Drug use: Never   Sexual activity: Not Currently  Other Topics Concern   Not on file  Social History Narrative   Not on file   Social Determinants of Health   Financial Resource Strain: Not on file  Food Insecurity: Not on file  Transportation Needs: Not on file  Physical Activity: Not on file  Stress: Not on file  Social Connections: Not on file     Family History: The patient's family history includes Charcot-Marie-Tooth disease in her brother, daughter, and mother; Heart disease in her father; Thyroid disease in her mother. ROS:   Please see the history of present illness.    All 14 point review of systems negative except as  described per history of present illness  EKGs/Labs/Other Studies Reviewed:    EKG Interpretation Date/Time:  Thursday December 14 2022 14:24:26 EDT Ventricular Rate:  57 PR Interval:  172 QRS Duration:  88 QT Interval:  414 QTC Calculation: 402 R Axis:   38  Text Interpretation: Sinus bradycardia Otherwise normal ECG No previous ECGs available Confirmed by Gypsy Balsam [phone]) on 12/14/2022 2:43:58 PM    Recent Labs: 05/29/2022: BUN 20; Creatinine, Ser 0.99; Potassium 4.3; Sodium 142  Recent Lipid Panel    Component Value Date/Time   CHOL 192 08/04/2020 1432   TRIG 111 08/04/2020 1432   HDL 78 08/04/2020 1432   CHOLHDL 2.5 08/04/2020 1432   LDLCALC 95 08/04/2020 1432    Physical Exam:    VS:  BP 120/64 (BP Location: Left  Arm, Patient Position: Sitting)   Pulse (!) 57   Ht 5\' 4"  (1.626 m)   Wt 143 lb 12.8 oz (65.2 kg)   SpO2 92%   BMI 24.68 kg/m     Wt Readings from Last 3 Encounters:  12/14/22 143 lb 12.8 oz (65.2 kg)  05/29/22 158 lb (71.7 kg)  11/25/21 166 lb 6.4 oz (75.5 kg)     GEN:  Well nourished, well developed in no acute distress HEENT: Normal NECK: No JVD; No carotid bruits LYMPHATICS: No lymphadenopathy CARDIAC: RRR, systolic ejection murmur grade 2/6 best heard right upper portion of the sternum, no rubs, no gallops RESPIRATORY:  Clear to auscultation without rales, wheezing or rhonchi  ABDOMEN: Soft, non-tender, non-distended MUSCULOSKELETAL:  No edema; No deformity  SKIN: Warm and dry LOWER EXTREMITIES: no swelling NEUROLOGIC:  Alert and oriented x 3 PSYCHIATRIC:  Normal affect   ASSESSMENT:    1. Paroxysmal atrial fibrillation (HCC)   2. Chronic diastolic congestive heart failure (HCC)   3. Dyslipidemia   4. History of CVA (cerebrovascular accident) without residual deficits   5. Nonrheumatic aortic valve stenosis   6. Preop cardiovascular exam    PLAN:    In order of problems listed above:  Aortic stenosis moderate last time.   It is time to repeat her echocardiogram to recheck severity of stenosis if there is no critical stenosis then Lexiscan will be done to make sure she does not have any ischemic heart disease.  That should be done before surgery if those 2 tests are fine that should be no problem from my point of view to proceed with surgery.  Her anticoagulation need to be withdrawn 24 to 48 hours before surgery and restart as quickly as feasible from surgical point review. Chronic diastolic congestive heart.:  Seems to be compensated. Dyslipidemia I did review K PN which show me her LDL 95 HDL 78 however this is from 2022 we will call primary care physician to get more updated information. Paroxysmal atrial fibrillation maintained sinus rhythm, anticoagulated   Medication Adjustments/Labs and Tests Ordered: Current medicines are reviewed at length with the patient today.  Concerns regarding medicines are outlined above.  Orders Placed This Encounter  Procedures   EKG 12-Lead   Medication changes: No orders of the defined types were placed in this encounter.   Signed, Georgeanna Lea, MD, [facility] 12/14/2022 2:59 PM    [facility]

## 2022-12-14 NOTE — Patient Instructions (Signed)
Medication Instructions:  Your physician recommends that you continue on your current medications as directed. Please refer to the Current Medication list given to you today.  *If you need a refill on your cardiac medications before your next appointment, please call your pharmacy*   Lab Work: None Ordered If you have labs (blood work) drawn today and your tests are completely normal, you will receive your results only by: MyChart Message (if you have MyChart) OR A paper copy in the mail If you have any lab test that is abnormal or we need to change your treatment, we will call you to review the results.   Testing/Procedures: Your physician has requested that you have an echocardiogram. Echocardiography is a painless test that uses sound waves to create images of your heart. It provides your doctor with information about the size and shape of your heart and how well your heart's chambers and valves are working. This procedure takes approximately one hour. There are no restrictions for this procedure. Please do NOT wear cologne, perfume, aftershave, or lotions (deodorant is allowed). Please arrive 15 minutes prior to your appointment time.   Your physician has requested that you have a lexiscan myoview. For further information please visit [email or web address] Please follow instruction sheet, as given.  The test will take approximately 3 to 4 hours to complete; you may bring reading material.  If someone comes with you to your appointment, they will need to remain in the main lobby due to limited space in the testing area. **If you are pregnant or breastfeeding, please notify the nuclear lab prior to your appointment**  How to prepare for your Myocardial Perfusion Test: Do not eat or drink 3 hours prior to your test, except you may have water. Do not consume products containing caffeine (regular or decaffeinated) 12 hours prior to your test. (ex: coffee, chocolate, sodas, tea). Do bring a  list of your current medications with you.  If not listed below, you may take your medications as normal. Do wear comfortable clothes (no dresses or overalls) and walking shoes, tennis shoes preferred (No heels or open toe shoes are allowed). Do NOT wear cologne, perfume, aftershave, or lotions (deodorant is allowed). If these instructions are not followed, your test will have to be rescheduled.     Follow-Up: At [facility], you and your health needs are our priority.  As part of our continuing mission to provide you with exceptional heart care, we have created designated Provider Care Teams.  These Care Teams include your primary Cardiologist (physician) and Advanced Practice Providers (APPs -  Physician Assistants and Nurse Practitioners) who all work together to provide you with the care you need, when you need it.  We recommend signing up for the patient portal called "MyChart".  Sign up information is provided on this After Visit Summary.  MyChart is used to connect with patients for Virtual Visits (Telemedicine).  Patients are able to view lab/test results, encounter notes, upcoming appointments, etc.  Non-urgent messages can be sent to your provider as well.   To learn more about what you can do with MyChart, go to [email or web address].    Your next appointment:   5 month(s)  The format for your next appointment:   In Person  Provider:   Gypsy Balsam, MD    Other Instructions NA

## 2022-12-14 NOTE — Addendum Note (Signed)
Addended by: Baldo Ash D on: 12/14/2022 03:13 PM   Modules accepted: Orders

## 2022-12-19 ENCOUNTER — Telehealth: Payer: Self-pay

## 2022-12-19 DIAGNOSIS — I340 Nonrheumatic mitral (valve) insufficiency: Secondary | ICD-10-CM | POA: Diagnosis not present

## 2022-12-19 NOTE — Telephone Encounter (Signed)
Detailed instructions left on the patient's answering machine. Asked to call back with any questions. S.Aby Gessel CCT

## 2022-12-21 ENCOUNTER — Ambulatory Visit: Payer: Medicare Other | Attending: Cardiology

## 2022-12-21 DIAGNOSIS — Z01810 Encounter for preprocedural cardiovascular examination: Secondary | ICD-10-CM | POA: Diagnosis not present

## 2022-12-21 LAB — MYOCARDIAL PERFUSION IMAGING
LV dias vol: 101 mL (ref 46–106)
LV sys vol: 34 mL
Nuc Stress EF: 67 %
Peak HR: 63 {beats}/min
Rest HR: 47 {beats}/min
Rest Nuclear Isotope Dose: 9.8 mCi
SDS: 0
SRS: 2
SSS: 2
ST Depression (mm): 0 mm
Stress Nuclear Isotope Dose: 30.8 mCi
TID: 1.09

## 2022-12-21 MED ORDER — REGADENOSON 0.4 MG/5ML IV SOLN
0.4000 mg | Freq: Once | INTRAVENOUS | Status: AC
Start: 2022-12-21 — End: 2022-12-21
  Administered 2022-12-21: 0.4 mg via INTRAVENOUS

## 2022-12-21 MED ORDER — TECHNETIUM TC 99M TETROFOSMIN IV KIT
30.1000 | PACK | Freq: Once | INTRAVENOUS | Status: AC | PRN
  Administered 2022-12-21: 30.8 via INTRAVENOUS

## 2022-12-21 MED ORDER — TECHNETIUM TC 99M TETROFOSMIN IV KIT
10.9000 | PACK | Freq: Once | INTRAVENOUS | Status: AC | PRN
  Administered 2022-12-21: 9.8 via INTRAVENOUS

## 2022-12-26 ENCOUNTER — Telehealth: Payer: Self-pay

## 2022-12-26 NOTE — Telephone Encounter (Signed)
-----   Message from Gypsy Balsam sent at 12/22/2022  9:28 AM EDT ----- Stress test was normal

## 2022-12-26 NOTE — Telephone Encounter (Signed)
Left a message stating results sent to her my chart account.

## 2022-12-29 ENCOUNTER — Telehealth: Payer: Self-pay | Admitting: Cardiology

## 2022-12-29 NOTE — Telephone Encounter (Signed)
Calling to get a copy of patient echo results. Please advise   Fax #[phone]

## 2022-12-29 NOTE — Telephone Encounter (Signed)
Echo faxed per request

## 2023-01-11 NOTE — Telephone Encounter (Signed)
Dr. Bing Matter,   You saw this patient on 12/14/2022. Stress test was normal. Echo was ordered but has not been scheduled. Per office protocol, will you please comment on medical clearance for Vulvectomy, vaginectomy, inguinal node disection under general anesthesia (3 hour surgery) not yet scheduled?  Please route your response to P CV DIV Preop. I will communicate with requesting office once you have given recommendations.   Thank you!  Carlos Levering, NP

## 2023-01-15 ENCOUNTER — Other Ambulatory Visit: Payer: Medicare Other

## 2023-01-15 NOTE — Telephone Encounter (Signed)
     Primary Cardiologist: Dr. Bing Matter  Chart reviewed as part of pre-operative protocol coverage. Given past medical history and time since last visit, based on ACC/AHA guidelines, Girlene Zeller would be at acceptable risk for the planned procedure without further cardiovascular testing.   Her RCRI is a moderate risk, 6.6% risk of major cardiac event.  Patient with diagnosis of afib on Eliquis for anticoagulation.     Procedure: Vulvectomy, vaginectomy, inguinal node disection with general anesthesia   Date of procedure: TBD   CHA2DS2-VASc Score = 7  This indicates a 11.2% annual risk of stroke. The patient's score is based upon: CHF History: 1 HTN History: 1 Diabetes History: 0 (listed on PMH but A1c is normal on no meds) Stroke History: 2 Vascular Disease History: 0 Age Score: 2 Gender Score: 1   CVA 2016   CrCl 42mL/min Platelet count 252K   Per office protocol, patient can hold Eliquis for 2-3 days prior to procedure. Resume as soon as safely possible post-procedure.  I will route this recommendation to the requesting party via Epic fax function and remove from pre-op pool.  Please call with questions.  Thomasene Ripple. Kyle Stansell NP-C     01/15/2023, 9:24 AM [facility] [address] Office [phone] Fax [phone]

## 2023-02-06 DIAGNOSIS — I959 Hypotension, unspecified: Secondary | ICD-10-CM | POA: Insufficient documentation

## 2023-02-06 DIAGNOSIS — T8131XS Disruption of external operation (surgical) wound, not elsewhere classified, sequela: Secondary | ICD-10-CM | POA: Insufficient documentation

## 2023-02-06 DIAGNOSIS — Z9189 Other specified personal risk factors, not elsewhere classified: Secondary | ICD-10-CM | POA: Insufficient documentation

## 2023-02-06 DIAGNOSIS — T8189XA Other complications of procedures, not elsewhere classified, initial encounter: Secondary | ICD-10-CM | POA: Insufficient documentation

## 2023-02-07 ENCOUNTER — Other Ambulatory Visit: Payer: Medicare Other

## 2023-02-14 ENCOUNTER — Ambulatory Visit: Payer: Medicare Other | Admitting: Cardiology

## 2023-02-28 DIAGNOSIS — L0390 Cellulitis, unspecified: Secondary | ICD-10-CM | POA: Insufficient documentation

## 2023-04-07 DIAGNOSIS — D6859 Other primary thrombophilia: Secondary | ICD-10-CM | POA: Insufficient documentation

## 2023-04-07 DIAGNOSIS — J189 Pneumonia, unspecified organism: Secondary | ICD-10-CM | POA: Insufficient documentation

## 2023-04-07 DIAGNOSIS — D692 Other nonthrombocytopenic purpura: Secondary | ICD-10-CM | POA: Insufficient documentation

## 2023-04-07 DIAGNOSIS — C579 Malignant neoplasm of female genital organ, unspecified: Secondary | ICD-10-CM | POA: Insufficient documentation

## 2023-04-07 DIAGNOSIS — R2681 Unsteadiness on feet: Secondary | ICD-10-CM | POA: Insufficient documentation

## 2023-04-07 DIAGNOSIS — R262 Difficulty in walking, not elsewhere classified: Secondary | ICD-10-CM | POA: Insufficient documentation

## 2023-04-07 DIAGNOSIS — R41841 Cognitive communication deficit: Secondary | ICD-10-CM | POA: Insufficient documentation

## 2023-04-07 DIAGNOSIS — R5381 Other malaise: Secondary | ICD-10-CM | POA: Insufficient documentation

## 2023-04-07 DIAGNOSIS — I509 Heart failure, unspecified: Secondary | ICD-10-CM | POA: Insufficient documentation

## 2023-05-12 DIAGNOSIS — L0292 Furuncle, unspecified: Secondary | ICD-10-CM | POA: Insufficient documentation

## 2023-05-16 ENCOUNTER — Other Ambulatory Visit: Payer: Self-pay

## 2023-05-16 MED ORDER — DILTIAZEM HCL ER COATED BEADS 180 MG PO CP24: 180.0000 mg | ORAL_CAPSULE | Freq: Every day | ORAL | 2 refills | Status: DC

## 2023-06-05 DIAGNOSIS — N879 Dysplasia of cervix uteri, unspecified: Secondary | ICD-10-CM | POA: Insufficient documentation

## 2023-06-05 DIAGNOSIS — A630 Anogenital (venereal) warts: Secondary | ICD-10-CM | POA: Insufficient documentation

## 2023-07-04 ENCOUNTER — Telehealth: Payer: Self-pay | Admitting: Cardiology

## 2023-07-04 NOTE — Telephone Encounter (Signed)
 Patient stated she will be dropping off the application to renew her disability placard for Dr. Bing Matter to complete and sign.

## 2023-07-04 NOTE — Telephone Encounter (Signed)
 Advised pt to have her PCP to fill out the disability form. She stated that he was in [facility] and we was near by and she would bring it by our office

## 2023-10-16 ENCOUNTER — Other Ambulatory Visit: Payer: Self-pay | Admitting: Cardiology

## 2023-11-25 ENCOUNTER — Other Ambulatory Visit: Payer: Self-pay | Admitting: Cardiology

## 2023-12-17 ENCOUNTER — Other Ambulatory Visit: Payer: Self-pay | Admitting: Cardiology

## 2023-12-28 ENCOUNTER — Telehealth: Payer: Self-pay | Admitting: Cardiology

## 2023-12-28 MED ORDER — DILTIAZEM HCL ER COATED BEADS 180 MG PO CP24: 180.0000 mg | ORAL_CAPSULE | Freq: Every day | ORAL | 0 refills | Status: DC

## 2023-12-28 NOTE — Telephone Encounter (Signed)
 Pt's medication was sent to pt's pharmacy as requested. Confirmation received.

## 2023-12-28 NOTE — Telephone Encounter (Signed)
*  STAT* If patient is at the pharmacy, call can be transferred to refill team.   1. Which medications need to be refilled? (please list name of each medication and dose if known)  diltiazem  (CARDIZEM  CD) 240 MG 24 hr capsule  2. Which pharmacy/location (including street and city if local pharmacy) is medication to be sent to? [facility] - [place], [place] - [address]  3. Do they need a 30 day or 90 day supply?  30 day supply

## 2024-02-18 ENCOUNTER — Encounter: Payer: Self-pay | Admitting: *Deleted

## 2024-02-18 DIAGNOSIS — Z8719 Personal history of other diseases of the digestive system: Secondary | ICD-10-CM | POA: Insufficient documentation

## 2024-02-20 ENCOUNTER — Encounter: Payer: Self-pay | Admitting: Cardiology

## 2024-02-20 ENCOUNTER — Ambulatory Visit: Attending: Cardiology | Admitting: Cardiology

## 2024-02-20 VITALS — BP 100/60 | HR 67 | Ht 64.0 in | Wt 146.0 lb

## 2024-02-20 DIAGNOSIS — I10 Essential (primary) hypertension: Secondary | ICD-10-CM | POA: Diagnosis not present

## 2024-02-20 DIAGNOSIS — I350 Nonrheumatic aortic (valve) stenosis: Secondary | ICD-10-CM

## 2024-02-20 DIAGNOSIS — I480 Paroxysmal atrial fibrillation: Secondary | ICD-10-CM | POA: Diagnosis not present

## 2024-02-20 DIAGNOSIS — R079 Chest pain, unspecified: Secondary | ICD-10-CM | POA: Diagnosis not present

## 2024-02-20 MED ORDER — DILTIAZEM HCL ER COATED BEADS 120 MG PO CP24
120.0000 mg | ORAL_CAPSULE | Freq: Every day | ORAL | 3 refills | Status: AC
Start: 2024-02-20 — End: ?

## 2024-02-20 NOTE — Progress Notes (Signed)
 Cardiology Office Note:    Date:  02/20/2024   ID:  Kathryn Moody, DOB [birth year], MRN [id number]  PCP:  Zachary Lamar BRAVO, NP  Cardiologist:  Lamar Fitch, MD    Referring MD: Zachary Lamar BRAVO, NP   Chief Complaint  Patient presents with   Follow-up  Doing fine  History of Present Illness:    Kathryn Moody is a 78 y.o. 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  Comes today to months for follow-up she complained of being weak tired and dizzy also some swelling of lower extremities which is maybe slightly worse.  Interesting her EKG today show atrial fibrillation with relatively slow ventricular rate.  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   Dyslipidemia 12/03/2014   Dysphagia following cerebral infarction 07/30/2020   Dysuria 07/30/2020   Encounter for immunization 07/30/2020   Essential hypertension 12/03/2014   Gastro-esophageal reflux disease without esophagitis 07/30/2020   GI bleed 10/11/2016   Hemorrhoids 07/30/2020   Hyperthyroidism 06/06/2019   Hypothyroidism 06/06/2019   Impacted cerumen 07/30/2020   Late effects of CVA (cerebrovascular accident) 12/03/2014   Left carotid bruit 03/07/2018   Long term (current) use of anticoagulants 07/30/2020    Mixed hyperlipidemia 07/30/2020   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 2.7 cm nodule on right lobe and 2.0 cm nodule on left lobe.   Other vitamin B12 deficiency anemias 07/30/2020   Pain in limb 07/30/2020   Paroxysmal atrial fibrillation (HCC) 12/03/2014   Chads fast score equals 5, anticoagulated with Eliquis  Chads fast score equals 5, anticoagulated with Eliquis    Primary generalized (osteo)arthritis 07/30/2020   Shoulder joint pain 07/30/2020   Type 2 diabetes mellitus without complications (HCC) 07/30/2020   Urinary tract infectious disease 07/30/2020   Vitamin D deficiency 07/30/2020    Past Surgical History:  Procedure Laterality Date   MOUTH SURGERY      Current Medications: Current Meds  Medication Sig   albuterol (VENTOLIN HFA) 108 (90 Base) MCG/ACT inhaler Inhale 1 puff into the lungs as needed for wheezing or shortness of breath.   apixaban  (ELIQUIS ) 5 MG TABS tablet Take 1 tablet (5 mg total) by mouth 2 (two) times daily.   Ascorbic Acid (VITAMIN C ER PO) Take 1 tablet by mouth daily.   atorvastatin  (LIPITOR) 20 MG tablet Take 1 tablet (20 mg total) by mouth daily.   B Complex Vitamins (VITAMIN B COMPLEX) TABS Take 1 tablet by mouth daily. Unknown strength   budesonide-formoterol (SYMBICORT) 160-4.5 MCG/ACT inhaler Inhale 2 puffs into the lungs as needed (sob, wheezing).   Calcium  Carb-Ergocalciferol 500-200 MG-UNIT TABS Take 1 tablet by mouth daily.   carvedilol (COREG) 25 MG tablet Take 1 tablet by mouth 2 (two) times daily.   Cyanocobalamin (VITAMIN B-12 PO) Take 1 tablet by mouth daily.   diltiazem  (CARTIA  XT)  180 MG 24 hr capsule Take 1 capsule (180 mg total) by mouth daily.   Ferrous Gluconate (IRON 27 PO) Take 1 tablet by mouth daily.   fluticasone (FLONASE) 50 MCG/ACT nasal spray Place 1 spray into both nostrils daily.   folic acid (FOLVITE) 1 MG tablet Take 1 mg by mouth daily.   magnesium oxide (MAG-OX) 400 MG tablet  Take 1 tablet by mouth daily.   methimazole (TAPAZOLE) 10 MG tablet Take 1 tablet by mouth daily.   Multiple Vitamin (MULTIVITAMIN ADULT PO) Take 1 tablet by mouth daily.   NASAL SALINE NA Place 1 spray into the nose daily.   pantoprazole (PROTONIX) 40 MG tablet Take 40 mg by mouth daily.   Potassium Chloride ER 20 MEQ TBCR Take 6 tablets by mouth daily.    Pyridoxine HCl (VITAMIN B-6 PO) Take 1 tablet by mouth daily.   torsemide  (DEMADEX ) 20 MG tablet Take 20 mg by mouth daily.   VITAMIN A PO Take 1 tablet by mouth daily.   VITAMIN E PO Take 1 tablet by mouth daily.   vitamin k 100 MCG tablet Take 100 mcg by mouth daily.   zinc gluconate 50 MG tablet Take 50 mg by mouth daily.     Allergies:   Prednisone and Digoxin   Social History   Socioeconomic History   Marital status: Widowed    Spouse name: Not on file   Number of children: Not on file   Years of education: Not on file   Highest education level: Not on file  Occupational History   Not on file  Tobacco Use   Smoking status: Former   Smokeless tobacco: Never  Vaping Use   Vaping status: Never Used  Substance and Sexual Activity   Alcohol use: Yes   Drug use: Never   Sexual activity: Not Currently  Other Topics Concern   Not on file  Social History Narrative   Not on file   Social Drivers of Health   Financial Resource Strain: Not on file  Food Insecurity: Low Risk  (06/12/2023)   Received from [facility]   Hunger Vital Sign    Within the past 12 months, you worried that your food would run out before you got money to buy more: Never true    Within the past 12 months, the food you bought just didn't last and you didn't have money to get more. : Never true  Transportation Needs: No Transportation Needs (06/12/2023)   Received from [facility]    In the past 12 months, has lack of reliable transportation kept you from medical appointments, meetings, work or from getting things needed for daily  living? : No  Physical Activity: Not on file  Stress: Not on file  Social Connections: Not on file     Family History: The patient's family history includes Charcot-Marie-Tooth disease in her brother, daughter, and mother; Heart disease in her father; Thyroid  disease in her mother. ROS:   Please see the history of present illness.    All 14 point review of systems negative except as described per history of present illness  EKGs/Labs/Other Studies Reviewed:    EKG Interpretation Date/Time:  Wednesday February 20 2024 11:40:31 EST Ventricular Rate:  67 PR Interval:    QRS Duration:  92 QT Interval:  372 QTC Calculation: 393 R Axis:   79  Text Interpretation: Atrial fibrillation When compared with ECG of 14-Dec-2022 14:24, Atrial fibrillation has replaced Sinus rhythm Confirmed  by Bernie Charleston [phone]) on 02/20/2024 11:45:13 AM    Recent Labs: No results found for requested labs within last 365 days.  Recent Lipid Panel    Component Value Date/Time   CHOL 192 08/04/2020 1432   TRIG 111 08/04/2020 1432   HDL 78 08/04/2020 1432   CHOLHDL 2.5 08/04/2020 1432   LDLCALC 95 08/04/2020 1432    Physical Exam:    VS:  BP 100/60   Pulse 67   Ht 5' 4 (1.626 m)   Wt 146 lb (66.2 kg)   SpO2 98%   BMI 25.06 kg/m     Wt Readings from Last 3 Encounters:  02/20/24 146 lb (66.2 kg)  12/21/22 143 lb (64.9 kg)  12/14/22 143 lb 12.8 oz (65.2 kg)     GEN:  Well nourished, well developed in no acute distress HEENT: Normal NECK: No JVD; No carotid bruits LYMPHATICS: No lymphadenopathy CARDIAC: Irregularly irregular, systolic murmur grade 2/6 to 3/6 best heard right upper portion sternal, no rubs, no gallops RESPIRATORY:  Clear to auscultation without rales, wheezing or rhonchi  ABDOMEN: Soft, non-tender, non-distended MUSCULOSKELETAL:  No edema; No deformity  SKIN: Warm and dry LOWER EXTREMITIES: no swelling NEUROLOGIC:  Alert and oriented x 3 PSYCHIATRIC:  Normal affect    ASSESSMENT:    1. Essential hypertension   2. Nonrheumatic aortic valve stenosis   3. Paroxysmal atrial fibrillation (HCC)    PLAN:    In order of problems listed above:  Paroxysmal atrial fibrillation, she is in atrial fibrillation right now does not have any feeling of being out of rhythm.  She is anticoagulated taking all anticoagulation religiously.  I will reduce dose of Cardizem  from 1 80-1 plan, will bring her back to our office next week to see if she still in atrial fibrillation if she is we may put monitor on her to see if she is a paroxysmal of atrial fibrillation or persistent atrial fibrillation and then decide what will be next step. History of aortic stenosis, will repeat echocardiogram to recheck on the significance of the lesion. History of gynecological cancer excellent treatment by oncology team doing well tolerated chemotherapy amazingly well. COPD which is advanced followed by pulmonary.   Medication Adjustments/Labs and Tests Ordered: Current medicines are reviewed at length with the patient today.  Concerns regarding medicines are outlined above.  Orders Placed This Encounter  Procedures   EKG 12-Lead   Medication changes: No orders of the defined types were placed in this encounter.   Signed, [place] DOROTHA Bernie, MD, [facility] 02/20/2024 11:58 AM    [facility]

## 2024-02-20 NOTE — Patient Instructions (Addendum)
 Medication Instructions:   DECREASE: Cardizem  to 120mg  daily   Lab Work: Thyroid , TSH, Troponin - today If you have labs (blood work) drawn today and your tests are completely normal, you will receive your results only by: MyChart Message (if you have MyChart) OR A paper copy in the mail If you have any lab test that is abnormal or we need to change your treatment, we will call you to review the results.   Testing/Procedures: Your physician has requested that you have an echocardiogram. Echocardiography is a painless test that uses sound waves to create images of your heart. It provides your doctor with information about the size and shape of your heart and how well your heart's chambers and valves are working. This procedure takes approximately one hour. There are no restrictions for this procedure. Please do NOT wear cologne, perfume, aftershave, or lotions (deodorant is allowed). Please arrive 15 minutes prior to your appointment time.  Please note: We ask at that you not bring children with you during ultrasound (echo/ vascular) testing. Due to room size and safety concerns, children are not allowed in the ultrasound rooms during exams. Our front office staff cannot provide observation of children in our lobby area while testing is being conducted. An adult accompanying a patient to their appointment will only be allowed in the ultrasound room at the discretion of the ultrasound technician under special circumstances. We apologize for any inconvenience.    Follow-Up: At [facility], you and your health needs are our priority.  As part of our continuing mission to provide you with exceptional heart care, we have created designated Provider Care Teams.  These Care Teams include your primary Cardiologist (physician) and Advanced Practice Providers (APPs -  Physician Assistants and Nurse Practitioners) who all work together to provide you with the care you need, when you need it.  We  recommend signing up for the patient portal called MyChart.  Sign up information is provided on this After Visit Summary.  MyChart is used to connect with patients for Virtual Visits (Telemedicine).  Patients are able to view lab/test results, encounter notes, upcoming appointments, etc.  Non-urgent messages can be sent to your provider as well.   To learn more about what you can do with MyChart, go to [email or web address].    Your next appointment:   3 week(s)  The format for your next appointment:   In Person  Provider:   Lamar Fitch, MD    Other Instructions Nurse visit for EKG next week

## 2024-02-21 LAB — THYROID PANEL WITH TSH
Free Thyroxine Index: 2 (ref 1.2–4.9)
T3 Uptake Ratio: 33 % (ref 24–39)
T4, Total: 6.2 ug/dL (ref 4.5–12.0)
TSH: 3.37 u[IU]/mL (ref 0.450–4.500)

## 2024-02-21 LAB — TROPONIN T: Troponin T (Highly Sensitive): 12 ng/L (ref 0–14)

## 2024-02-26 ENCOUNTER — Ambulatory Visit

## 2024-02-26 ENCOUNTER — Ambulatory Visit: Attending: Cardiology

## 2024-02-26 VITALS — BP 100/60 | HR 95 | Ht 64.0 in

## 2024-02-26 DIAGNOSIS — I480 Paroxysmal atrial fibrillation: Secondary | ICD-10-CM

## 2024-02-26 DIAGNOSIS — I10 Essential (primary) hypertension: Secondary | ICD-10-CM | POA: Diagnosis not present

## 2024-02-26 DIAGNOSIS — I350 Nonrheumatic aortic (valve) stenosis: Secondary | ICD-10-CM | POA: Diagnosis not present

## 2024-02-26 LAB — ECHOCARDIOGRAM COMPLETE
AR max vel: 1.04 cm2
AV Area VTI: 0.8 cm2
AV Area mean vel: 0.98 cm2
AV Mean grad: 15 mmHg
AV Peak grad: 27.4 mmHg
Ao pk vel: 2.62 m/s
Area-P 1/2: 4.68 cm2
MV M vel: 2.47 m/s
MV Peak grad: 24.4 mmHg
MV VTI: 1.62 cm2
P 1/2 time: 456 ms
S' Lateral: 3 cm

## 2024-02-26 NOTE — Progress Notes (Signed)
   Nurse Visit   Date of Encounter: 02/26/2024 ID: [name], DOB [birth year], MRN [id number]  PCP:  Zachary Lamar BRAVO, NP   [facility] Providers Cardiologist:  None      Visit Details   VS:  Ht 5' 4 (1.626 m)   BMI 25.06 kg/m  , BMI Body mass index is 25.06 kg/m.  Wt Readings from Last 3 Encounters:  02/20/24 146 lb (66.2 kg)  12/21/22 143 lb (64.9 kg)  12/14/22 143 lb 12.8 oz (65.2 kg)     Reason for visit: EKG Performed today: EKG Changes (medications, testing, etc.) : Zio per Dr. Krasowski - 14 Day for A fib Length of Visit: 15 minutes    Medications Adjustments/Labs and Tests Ordered: Orders Placed This Encounter  Procedures   EKG 12-Lead   No orders of the defined types were placed in this encounter.    Signed, Olam JONETTA Lesches, RN  02/26/2024 3:58 PM

## 2024-02-26 NOTE — Addendum Note (Signed)
 Addended by: ARLOA PLANAS D on: 02/26/2024 04:18 PM   Modules accepted: Orders

## 2024-02-29 ENCOUNTER — Ambulatory Visit: Payer: Self-pay | Admitting: Cardiology

## 2024-03-06 ENCOUNTER — Other Ambulatory Visit: Payer: Self-pay | Admitting: Cardiology

## 2024-03-13 ENCOUNTER — Encounter: Payer: Self-pay | Admitting: Cardiology

## 2024-03-13 ENCOUNTER — Ambulatory Visit: Attending: Cardiology | Admitting: Cardiology

## 2024-03-13 VITALS — BP 110/60 | HR 82 | Ht 64.0 in | Wt 153.0 lb

## 2024-03-13 DIAGNOSIS — I10 Essential (primary) hypertension: Secondary | ICD-10-CM | POA: Diagnosis not present

## 2024-03-13 DIAGNOSIS — I480 Paroxysmal atrial fibrillation: Secondary | ICD-10-CM | POA: Diagnosis not present

## 2024-03-13 DIAGNOSIS — E119 Type 2 diabetes mellitus without complications: Secondary | ICD-10-CM

## 2024-03-13 NOTE — Patient Instructions (Signed)

## 2024-03-13 NOTE — Progress Notes (Signed)
 Cardiology Office Note:     Date:  03/13/2024          Chief Complaint  Patient presents with   Follow-up  Doing fine   History of Present Illness:     Kathryn Moody is a 78 y.o. female past medical history significant for coronary disease status post coronary bypass graft on 4 vessels in September 2024, her initial ejection fraction 20 to 25% improved 5055 with therapy, type 2 diabetes, essential hypertension, recovery was complicated by episode of atrial fibrillation treated with amiodarone comes today to months for follow-up.  Still sounds like she is in atrial fibrillation.  She did wear a monitor we do not have it back.  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 left pinna   Subungual hematoma of great toe of left foot 11/11/2019   Systolic murmur  87/85/7976   Type II diabetes mellitus (HCC) 01/03/2016   Upper back pain on right side 04/16/2020    hx               Past Surgical History:  Procedure Laterality Date   COLONOSCOPY        x 2   CORONARY ARTERY BYPASS GRAFT N/A 11/30/2022    Procedure: CORONARY ARTERY BYPASS GRAFTING (CABG)x4,USING LEFT INTERNAL MAMMARY ARTERY AND RIGHT GREATER SAPHENEOUS VEIN HARVESTED ENDOSCOPICLY AND LEFT LEG GREATER SAPHENEOUS VEIN OPEN HARVESTED;  Surgeon: Kerrin Elspeth BROCKS, MD;  Location: [facility] OR;  Service: Open Heart Surgery;  Laterality: N/A;   EYE SURGERY Left 2021    Re attached Retina, Did not work   KNEE SURGERY Right      yrs ago   LEFT HEART CATH AND CORONARY ANGIOGRAPHY N/A 11/10/2022    Procedure: LEFT HEART CATH AND CORONARY ANGIOGRAPHY;  Surgeon: Verlin Lonni BIRCH, MD;  Location: [facility] INVASIVE CV [facility];  Service: Cardiovascular;  Laterality: N/A;   TEE WITHOUT CARDIOVERSION N/A 11/30/2022    Procedure: TRANSESOPHAGEAL ECHOCARDIOGRAM;  Surgeon: Kerrin Elspeth BROCKS, MD;  Location: [facility] OR;  Service: Open Heart Surgery;  Laterality: N/A;          Current Medications: [Active Medications]   [place] Medications]     Current Meds  Medication  Sig   aspirin EC 325 MG tablet Take 1 tablet (325 mg total) by mouth daily.   brimonidine (ALPHAGAN) 0.2 % ophthalmic solution Place 1 drop into the left eye daily.   buPROPion (WELLBUTRIN SR) 150 MG 12 hr tablet Take 1 tablet (150 mg total) by mouth 2 (two) times daily.   dorzolamide (TRUSOPT) 2 % ophthalmic solution Place 1 drop into the left eye 2 (two) times daily.   ENTRESTO 24-26 MG Take 1 tablet by mouth 2 (two) times daily.   FARXIGA 10 MG TABS tablet TAKE 1 TABLET BY MOUTH DAILY BEFORE BREAKFAST.   metFORMIN (GLUCOPHAGE) 1000 MG tablet Take 1,000 mg by mouth 2 (two) times daily with a meal.   MOUNJARO 2.5 MG/0.5ML Pen Inject 2.5 mg into the skin once a week.   Polyethyl Glycol-Propyl Glycol (SYSTANE) 0.4-0.3 % SOLN Place 1 drop into  both eyes daily as needed (dry eye).   rosuvastatin (CRESTOR) 10 MG tablet Take 1 tablet (10 mg total) by mouth daily.   TOUJEO MAX SOLOSTAR 300 UNIT/ML Solostar Pen Inject 40 Units into the skin at bedtime.   venlafaxine XR (EFFEXOR-XR) 150 MG 24 hr capsule Take 1 capsule (150 mg total) by mouth daily with breakfast.     Allergies:   Patient has no known allergies.    Social History         Socioeconomic History   Marital status: Divorced      Spouse name: Not on file   Number of children: 0   Years of education: Not on file   Highest education level: Not on file  Occupational History   Not on file  Tobacco Use   Smoking status: Some Days      Current packs/day: 0.50      Average packs/day: 0.5 packs/day for 55.0 years (27.5 ttl pk-yrs)      Types: Cigarettes      Start date: 36      Passive exposure: Current   Smokeless tobacco: Never  Vaping Use   Vaping status: Never Used  Substance and Sexual Activity   Alcohol use: Never   Drug use: Never   Sexual activity: Yes  Other Topics Concern   Not on file  Social History Narrative   Not on file    Social Drivers of Health        Tobacco Use: High Risk (03/13/2024)    Patient History     Smoking Tobacco Use: Some Days     Smokeless Tobacco Use: Never     Passive Exposure: Current  Financial Resource Strain: Low Risk (10/12/2023)    Overall Financial Resource Strain (CARDIA)     Difficulty of Paying Living Expenses: Not very hard  Food Insecurity: No Food Insecurity (10/12/2023)    Epic     Worried About Programme Researcher, Broadcasting/film/video in the Last Year: Never true     Ran Out of Food in the Last Year: Never true  Transportation Needs: No Transportation Needs (10/12/2023)    Epic     Lack of Transportation (Medical): No     Lack of Transportation (Non-Medical): No  Physical Activity: Inactive (10/12/2023)    Exercise Vital Sign     Days of Exercise per Week: 0 days     Minutes of Exercise per Session: 0 min  Stress: Stress  Concern Present (10/12/2023)    [employer] of Occupational Health - Occupational Stress Questionnaire     Feeling of Stress: To some extent  Social  Connections: Moderately Integrated (10/12/2023)    Social Connection and Isolation Panel     Frequency of Communication with Friends and Family: More than three times a week     Frequency of Social Gatherings with Friends and Family: More than three times a week     Attends Religious Services: More than 4 times per year     Active Member of Clubs or Organizations: Yes     Attends Banker Meetings: More than 4 times per year     Marital Status: Divorced  Depression (PHQ2-9): Low Risk (11/07/2023)    Depression (PHQ2-9)     PHQ-2 Score: 0  Alcohol Screen: Low Risk (10/12/2023)    Alcohol Screen     Last Alcohol Screening Score (AUDIT): 0  Housing: Unknown (10/12/2023)    Epic     Unable to Pay for Housing in the Last Year: No     Number of Times Moved in the Last Year: Not on file     Homeless in the Last Year: No  Utilities: Not At Risk (10/12/2023)    Epic     Threatened with loss of utilities: No  Health Literacy: Adequate Health Literacy (10/12/2023)    B1300 Health Literacy     Frequency of need for help with medical instructions: Never      Family History: The patient's family history includes Brain cancer in his father; Diabetes in his maternal grandfather, maternal grandmother, mother, and paternal grandmother; Hearing loss in his father; Heart Problems in his sister. ROS:   Please see the history of present illness.    All 14 point review of systems negative except as described per history of present illness   EKGs/Labs/Other Studies Reviewed:            Recent Labs: 11/14/2023: ALT 22; BUN 37; Creatinine, Ser 1.04; Hemoglobin 14.7; Platelets 162; Potassium 4.7; Sodium 137  Recent Lipid Panel Labs (Brief)          Component Value Date/Time    CHOL 142 11/14/2023 0835    TRIG 103 11/14/2023 0835    HDL  48 11/14/2023 0835    CHOLHDL 3.0 11/14/2023 0835    LDLCALC 75 11/14/2023 0835    LDLDIRECT 44 04/10/2022 0915        Physical Exam:     VS:  BP (!) 140/80   Pulse 72   Ht 5' 9 (1.753 m)   Wt 211 lb (95.7 kg)   SpO2 97%   BMI 31.16 kg/m         Wt Readings from Last 3 Encounters:  03/13/24 211 lb (95.7 kg)  11/12/23 197 lb (89.4 kg)  11/07/23 208 lb 9.6 oz (94.6 kg)      GEN:  Well nourished, well developed in no acute distress HEENT: Normal NECK: No JVD; No carotid bruits LYMPHATICS: No lymphadenopathy CARDIAC: Irregularly irregular, no murmurs, no rubs, no gallops RESPIRATORY:  Clear to auscultation without rales, wheezing or rhonchi  ABDOMEN: Soft, non-tender, non-distended MUSCULOSKELETAL:  No edema; No deformity  SKIN: Warm and dry LOWER EXTREMITIES: no swelling NEUROLOGIC:  Alert and oriented x 3 PSYCHIATRIC:  Normal affect    ASSESSMENT:     1. Dilated cardiomyopathy (HCC)   2. Essential hypertension   3. Pulmonary emphysema (HCC)   4. S/P CABG x 4   5. Paroxysmal atrial fibrillation (HCC)     PLAN:     In order of problems listed above:   Paroxysmal atrial fibrillation, Eliquis  he  is in atrial fibrillation right now rate is controlled awaiting monitor if she does have paroxysmal atrial fibrillation will initiate amiodarone hopefully she will go back to normal sinus with amiodarone alone if not we will make arrangements for cardioversion.  See her back in my office in about 6 weeks to see how she does. Essential hypertension blood pressure well-controlled. Pulmonary she is doing well.  Not. Dyslipidemia, she is taking Lipitor 20 which is moderate intensity statin I did not have any recent fasting lipid profile which make arrangement to have it done     Medication Adjustments/Labs and Tests Ordered: Current medicines are reviewed at length with the patient today.  Concerns regarding medicines are outlined above.  No orders of the defined types were  placed in this encounter.   Medication changes: No orders of the defined types were placed in this encounter.     Signed, Lamar DOROTHA Fitch, MD, [facility] 03/13/2024 2:42 PM    [facility]

## 2024-03-25 DIAGNOSIS — I480 Paroxysmal atrial fibrillation: Secondary | ICD-10-CM

## 2024-03-31 ENCOUNTER — Ambulatory Visit: Payer: Self-pay | Admitting: Cardiology

## 2024-06-11 ENCOUNTER — Ambulatory Visit: Admitting: Cardiology
